# Patient Record
Sex: Male | Born: 1970 | Race: Black or African American | Hispanic: No | Marital: Single | Smoking: Never smoker
Health system: Southern US, Community
[De-identification: ages and names within clinical notes are randomized; demographics above are authoritative.]

## PROBLEM LIST (undated history)

## (undated) DIAGNOSIS — S0990XA Unspecified injury of head, initial encounter: Secondary | ICD-10-CM

## (undated) DIAGNOSIS — R569 Unspecified convulsions: Secondary | ICD-10-CM

## (undated) DIAGNOSIS — R0600 Dyspnea, unspecified: Secondary | ICD-10-CM

## (undated) DIAGNOSIS — S069XAA Unspecified intracranial injury with loss of consciousness status unknown, initial encounter: Secondary | ICD-10-CM

## (undated) DIAGNOSIS — Z87442 Personal history of urinary calculi: Secondary | ICD-10-CM

## (undated) DIAGNOSIS — T8859XA Other complications of anesthesia, initial encounter: Secondary | ICD-10-CM

## (undated) DIAGNOSIS — M419 Scoliosis, unspecified: Secondary | ICD-10-CM

## (undated) DIAGNOSIS — G43909 Migraine, unspecified, not intractable, without status migrainosus: Secondary | ICD-10-CM

## (undated) DIAGNOSIS — T148XXA Other injury of unspecified body region, initial encounter: Secondary | ICD-10-CM

## (undated) DIAGNOSIS — I639 Cerebral infarction, unspecified: Secondary | ICD-10-CM

## (undated) DIAGNOSIS — I10 Essential (primary) hypertension: Secondary | ICD-10-CM

## (undated) HISTORY — DX: Migraine, unspecified, not intractable, without status migrainosus: G43.909

## (undated) HISTORY — PX: KNEE ARTHROSCOPY: SUR90

## (undated) HISTORY — DX: Essential (primary) hypertension: I10

---

## 2009-09-13 ENCOUNTER — Emergency Department: Payer: Self-pay | Admitting: Emergency Medicine

## 2011-05-07 ENCOUNTER — Emergency Department: Payer: Self-pay | Admitting: Emergency Medicine

## 2011-05-12 ENCOUNTER — Emergency Department: Payer: Self-pay | Admitting: Emergency Medicine

## 2019-03-18 ENCOUNTER — Other Ambulatory Visit: Payer: Self-pay

## 2019-03-18 ENCOUNTER — Emergency Department
Admission: EM | Admit: 2019-03-18 | Discharge: 2019-03-18 | Disposition: A | Discharge status: Home / Self Care | Payer: PRIVATE HEALTH INSURANCE | Attending: Emergency Medicine | Admitting: Emergency Medicine

## 2019-03-18 ENCOUNTER — Encounter: Payer: Self-pay | Admitting: Emergency Medicine

## 2019-03-18 DIAGNOSIS — H5712 Ocular pain, left eye: Secondary | ICD-10-CM | POA: Diagnosis present

## 2019-03-18 DIAGNOSIS — H11442 Conjunctival cysts, left eye: Secondary | ICD-10-CM

## 2019-03-18 MED ORDER — TETRACAINE HCL 0.5 % OP SOLN
2.0000 [drp] | Freq: Once | OPHTHALMIC | Status: AC
  Administered 2019-03-18: 2 [drp] via OPHTHALMIC
  Filled 2019-03-18: qty 4

## 2019-03-18 MED ORDER — KETOROLAC TROMETHAMINE 0.5 % OP SOLN: 1.0000 [drp] | Freq: Four times a day (QID) | OPHTHALMIC | 0 refills | Status: DC

## 2019-03-18 MED ORDER — DEXAMETHASONE 0.1 % OP SUSP: 2.0000 [drp] | Freq: Two times a day (BID) | OPHTHALMIC | 0 refills | Status: DC

## 2019-03-18 NOTE — ED Provider Notes (Signed)
[facility] Emergency Department Provider Note  ____________________________________________  Time seen: Approximately 3:06 PM  I have reviewed the triage vital signs and the nursing notes.   HISTORY  Chief Complaint Eye Problem    HPI Micheal Baxter is a 48 y.o. male who presents the emergency department complaining of a "knot" on the left eye.  Patient reports that last night he felt as if an eyelash had fallen into his left eye and become irritated.  He denied any visual changes.  He rinse the eye out with saline and went to bed.  He awoke with continued foreign body sensation to the left eye.  Patient looked in the mirror and saw a "bulging knot" to the lateral aspect of the eye.  Patient denies any visual changes.  No trauma to the eye.  Patient does wear contacts but has not been wearing contacts recently.  No erythema or drainage from the left eye.  No other complaints at this time.         History reviewed. No pertinent past medical history.  There are no active problems to display for this patient.   History reviewed. No pertinent surgical history.  Prior to Admission medications   Medication Sig Start Date End Date Taking? Authorizing Provider  dexamethasone (DECADRON) 0.1 % ophthalmic suspension Place 2 drops into the left eye 2 (two) times daily. 03/18/19   Sheyla Zaffino, Delorise RoyalsJonathan D, PA-C  ketorolac (ACULAR) 0.5 % ophthalmic solution Place 1 drop into the right eye 4 (four) times daily. 03/18/19   Brendalyn Vallely, Delorise RoyalsJonathan D, PA-C    Allergies Patient has no known allergies.  No family history on file.  Social History Social History   Tobacco Use  . Smoking status: Not on file  Substance Use Topics  . Alcohol use: Not on file  . Drug use: Not on file     Review of Systems  Constitutional: No fever/chills Eyes: No visual changes. No discharge.  Positive for pain and "bulging knot" to the left lateral eye ENT: No upper respiratory  complaints. Cardiovascular: no chest pain. Respiratory: no cough. No SOB. Gastrointestinal: No abdominal pain.  No nausea, no vomiting.   Musculoskeletal: Negative for musculoskeletal pain. Skin: Negative for rash, abrasions, lacerations, ecchymosis. Neurological: Negative for headaches, focal weakness or numbness. 10-point ROS otherwise negative.  ____________________________________________   PHYSICAL EXAM:  VITAL SIGNS: ED Triage Vitals  Enc Vitals Group     BP 03/18/19 1435 (!) 149/106     Pulse Rate 03/18/19 1435 86     Resp 03/18/19 1435 20     Temp 03/18/19 1435 98.6 F (37 C)     Temp Source 03/18/19 1435 Oral     SpO2 03/18/19 1435 97 %     Weight 03/18/19 1436 210 lb (95.3 kg)     Height 03/18/19 1436 5\' 11"  (1.803 m)     Head Circumference --      Peak Flow --      Pain Score 03/18/19 1436 7     Pain Loc --      Pain Edu? --      Excl. in [place]? --      Constitutional: Alert and oriented. Well appearing and in no acute distress. Eyes: Conjunctivae are normal. PERRL. EOMI. on exam of the left eye, patient does have vesiculation of the lateral cornea.  This appears to be clear, fluid-filled lesion.  No drainage.  No surrounding erythema.  No subconjunctival hemorrhaging.  No visible foreign body.  Funduscopic exam reveals red reflex bilaterally.  No hyphema.  Vasculature and optic disc is unremarkable in the left eye.  No evidence of retinal detachment. Head: Atraumatic. ENT:      Ears:       Nose: No congestion/rhinnorhea.      Mouth/Throat: Mucous membranes are moist.  Neck: No stridor.    Cardiovascular: Normal rate, regular rhythm. Normal S1 and S2.  Good peripheral circulation. Respiratory: Normal respiratory effort without tachypnea or retractions. Lungs CTAB. Good air entry to the bases with no decreased or absent breath sounds. Musculoskeletal: Full range of motion to all extremities. No gross deformities appreciated. Neurologic:  Normal speech and  language. No gross focal neurologic deficits are appreciated.  Skin:  Skin is warm, dry and intact. No rash noted. Psychiatric: Mood and affect are normal. Speech and behavior are normal. Patient exhibits appropriate insight and judgement.   ____________________________________________   LABS (all labs ordered are listed, but only abnormal results are displayed)  Labs Reviewed - No data to display ____________________________________________  EKG   ____________________________________________  RADIOLOGY   No results found.  ____________________________________________    PROCEDURES  Procedure(s) performed:    Procedures    Medications  tetracaine (PONTOCAINE) 0.5 % ophthalmic solution 2 drop (has no administration in time range)     ____________________________________________   INITIAL IMPRESSION / ASSESSMENT AND PLAN / ED COURSE  Pertinent labs & imaging results that were available during my care of the patient were reviewed by me and considered in my medical decision making (see chart for details).  Review of the [place] CSRS was performed in accordance of the [facility] prior to dispensing any controlled drugs.           Patient's diagnosis is consistent with conjunctival cyst.  Patient presents emergency department with a "bulging knot" to the left lateral eye.  On exam, patient has appears to be a conjunctival cyst.  This is likely from excessive rubbing from a foreign body sensation last night versus allergic.  Patient will be prescribed topical steroids and Acular for symptom improvement.  No further work-up deemed necessary at this time.  Patient will follow-up with ophthalmology if symptoms do not improve..  Patient is given ED precautions to return to the ED for any worsening or new symptoms.     ____________________________________________  FINAL CLINICAL IMPRESSION(S) / ED DIAGNOSES  Final diagnoses:  Conjunctival cyst of left eye      NEW  MEDICATIONS STARTED DURING THIS VISIT:  ED Discharge Orders         Ordered    dexamethasone (DECADRON) 0.1 % ophthalmic suspension  2 times daily     03/18/19 1529    ketorolac (ACULAR) 0.5 % ophthalmic solution  4 times daily     03/18/19 1529              This chart was dictated using voice recognition software/Dragon. Despite best efforts to proofread, errors can occur which can change the meaning. Any change was purely unintentional.    Racheal Patches, PA-C 03/18/19 1536    Minna Antis, MD 03/18/19 2144

## 2019-03-18 NOTE — ED Notes (Signed)
Says felt something in eye since yesterday. Painful today in left eye and has knot on outer conjutiva.  He is in nad and says pain is adequately controlled by aleve he took at home.

## 2019-03-18 NOTE — ED Notes (Signed)
Visual acuity testing (Snellen chart): 20/20 R/eye; 20/30 L/eye

## 2019-03-18 NOTE — ED Triage Notes (Signed)
L eye irritation since yesterday. 

## 2019-04-05 ENCOUNTER — Other Ambulatory Visit: Payer: Self-pay | Admitting: Ophthalmology

## 2019-04-05 DIAGNOSIS — H0589 Other disorders of orbit: Secondary | ICD-10-CM

## 2019-04-10 ENCOUNTER — Ambulatory Visit: Admission: RE | Admit: 2019-04-10 | Payer: PRIVATE HEALTH INSURANCE | Source: Ambulatory Visit

## 2019-04-24 ENCOUNTER — Ambulatory Visit
Admission: RE | Admit: 2019-04-24 | Discharge: 2019-04-24 | Disposition: A | Discharge status: Home / Self Care | Payer: Medicaid - Out of State | Source: Ambulatory Visit | Attending: Ophthalmology | Admitting: Ophthalmology

## 2019-04-24 ENCOUNTER — Other Ambulatory Visit: Payer: Self-pay

## 2019-04-24 DIAGNOSIS — H0589 Other disorders of orbit: Secondary | ICD-10-CM

## 2019-04-24 MED ORDER — GADOBUTROL 1 MMOL/ML IV SOLN
9.0000 mL | Freq: Once | INTRAVENOUS | Status: AC | PRN
  Administered 2019-04-24: 9 mL via INTRAVENOUS

## 2019-08-01 ENCOUNTER — Emergency Department
Admission: EM | Admit: 2019-08-01 | Discharge: 2019-08-01 | Disposition: A | Discharge status: Home / Self Care | Payer: PRIVATE HEALTH INSURANCE | Attending: Emergency Medicine | Admitting: Emergency Medicine

## 2019-08-01 ENCOUNTER — Encounter: Payer: Self-pay | Admitting: Emergency Medicine

## 2019-08-01 ENCOUNTER — Other Ambulatory Visit: Payer: Self-pay

## 2019-08-01 DIAGNOSIS — M545 Low back pain, unspecified: Secondary | ICD-10-CM

## 2019-08-01 MED ORDER — NAPROXEN 500 MG PO TABS: 500.0000 mg | ORAL_TABLET | Freq: Two times a day (BID) | ORAL | 0 refills | Status: DC

## 2019-08-01 MED ORDER — KETOROLAC TROMETHAMINE 30 MG/ML IJ SOLN
30.0000 mg | Freq: Once | INTRAMUSCULAR | Status: AC
  Administered 2019-08-01: 10:00:00 30 mg via INTRAMUSCULAR
  Filled 2019-08-01: qty 1

## 2019-08-01 MED ORDER — METHOCARBAMOL 500 MG PO TABS: ORAL_TABLET | ORAL | 0 refills | Status: DC

## 2019-08-01 MED ORDER — METHOCARBAMOL 500 MG PO TABS
1000.0000 mg | ORAL_TABLET | Freq: Once | ORAL | Status: AC
  Administered 2019-08-01: 1000 mg via ORAL
  Filled 2019-08-01: qty 2

## 2019-08-01 NOTE — ED Notes (Signed)
See triage note  Presents with lower back pain states he bent down and developed pain to back yesterday  States he has an old hx of injury to back approx 7 -8 years ago ambulates well to treatment area

## 2019-08-01 NOTE — ED Triage Notes (Signed)
Back pain. Says history of same.  Got bad at 11a yesterday at work.  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

## 2019-08-01 NOTE — Discharge Instructions (Signed)
Follow-up with your primary care provider or 1 of the clinics listed on your discharge papers to obtain a primary care provider.  Dr. Harlow Mares is the orthopedist on call and you should follow-up with him for any continued back problems.  Begin taking the medication as directed.  The muscle relaxant may cause drowsiness and should only be taken when you are at home.  Naproxen 500 mg's twice daily with food.  The methocarbamol is 1 or 2 tablets every 6 hours as needed for muscle spasms.  You may use ice or heat to your back as needed for discomfort.

## 2019-08-01 NOTE — ED Provider Notes (Signed)
[facility] Emergency Department Provider Note  ____________________________________________   First MD Initiated Contact with Patient 08/01/19 [phone]     (approximate)  I have reviewed the triage vital signs and the nursing notes.   HISTORY  Chief Complaint Back Pain   HPI Micheal Baxter is a 48 y.o. male presents to the ED with complaint of low back pain especially when he bends over or movement.  He states that he has a history of back injuries 7 to 8 years ago and since moving to [place] has not seen a orthopedist.  Patient believes that his back is aggravated by the lifting that he does at work.  He complains of right low back pain without radiation to his leg.  He rates his pain as an 8 out of 10.     History reviewed. No pertinent past medical history.  There are no active problems to display for this patient.   History reviewed. No pertinent surgical history.  Prior to Admission medications   Medication Sig Start Date End Date Taking? Authorizing Provider  methocarbamol (ROBAXIN) 500 MG tablet 1 or 2 tablets every 6 hours as needed for muscle spasms. 08/01/19   Johnn Hai, PA-C  naproxen (NAPROSYN) 500 MG tablet Take 1 tablet (500 mg total) by mouth 2 (two) times daily with a meal. 08/01/19   Johnn Hai, PA-C    Allergies Patient has no known allergies.  No family history on file.  Social History Social History   Tobacco Use  . Smoking status: Never Smoker  . Smokeless tobacco: Never Used  Substance Use Topics  . Alcohol use: Never    Frequency: Never  . Drug use: Not on file    Review of Systems Constitutional: No fever/chills Cardiovascular: Denies chest pain. Respiratory: Denies shortness of breath. Gastrointestinal: No abdominal pain.  No nausea, no vomiting.  No diarrhea.  No constipation. Genitourinary: Negative for dysuria. Musculoskeletal: Positive for low back pain. Neurological: Negative for  focal  weakness or numbness. ____________________________________________   PHYSICAL EXAM:  VITAL SIGNS: ED Triage Vitals  Enc Vitals Group     BP 08/01/19 0905 129/87     Pulse Rate 08/01/19 0905 63     Resp 08/01/19 0905 19     Temp 08/01/19 0905 98 F (36.7 C)     Temp Source 08/01/19 0905 Oral     SpO2 08/01/19 0905 99 %     Weight --      Height --      Head Circumference --      Peak Flow --      Pain Score 08/01/19 0901 8     Pain Loc --      Pain Edu? --      Excl. in [place]? --     Constitutional: Alert and oriented. Well appearing and in no acute distress. Eyes: Conjunctivae are normal.  Head: Atraumatic. Neck: No stridor.   Cardiovascular: Normal rate, regular rhythm. Grossly normal heart sounds.  Good peripheral circulation. Respiratory: Normal respiratory effort.  No retractions. Lungs CTAB. Gastrointestinal: Soft and nontender. No distention. No CVA tenderness. Musculoskeletal: No point tenderness is noted on palpation of the vertebral bodies and no soft tissue injury or edema is noted.  No discoloration.  There is more soft tissue tenderness noted on the right lateral paravertebral muscles.  Good muscle strength bilaterally.  Straight leg raises were negative.  Normal gait was noted. Neurologic:  Normal speech and language. No  gross focal neurologic deficits are appreciated.  Reflexes are 1+ bilaterally.  No gait instability. Skin:  Skin is warm, dry and intact. No rash noted. Psychiatric: Mood and affect are normal. Speech and behavior are normal.  ____________________________________________   LABS (all labs ordered are listed, but only abnormal results are displayed)  Labs Reviewed - No data to display  PROCEDURES  Procedure(s) performed (including Critical Care):  Procedures   ____________________________________________   INITIAL IMPRESSION / ASSESSMENT AND PLAN / ED COURSE  As part of my medical decision making, I reviewed the following data within  the electronic MEDICAL RECORD NUMBER Notes from prior ED visits and [place] Controlled Substance Database  48 year old male presents to the ED with complaint of back pain with bending and lifting at work.  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 case.  He has a history of back problems proximately 7 or 8 years ago but has not seen an orthopedist since.  Neurologically patient was intact.  He was tender right paravertebral muscles.  Patient was ambulatory without any assistance.  He was given an injection of Toradol and given methocarbamol 1000 mg p.o.  Patient was discharged with methocarbamol and naproxen.  He is encouraged to follow-up with Dr. Odis LusterBowers who is the orthopedist on call today if any continued problems.     ____________________________________________   FINAL CLINICAL IMPRESSION(S) / ED DIAGNOSES  Final diagnoses:  Acute right-sided low back pain without sciatica     ED Discharge Orders         Ordered    naproxen (NAPROSYN) 500 MG tablet  2 times daily with meals     08/01/19 1003    methocarbamol (ROBAXIN) 500 MG tablet     08/01/19 1003           Note:  This document was prepared using Dragon voice recognition software and may include unintentional dictation errors.    Tommi RumpsSummers, Gabbie Marzo L, PA-C 08/01/19 1231    Chesley NoonJessup, Charles, MD 08/01/19 [phone]

## 2020-10-16 ENCOUNTER — Other Ambulatory Visit: Payer: Self-pay

## 2020-10-16 ENCOUNTER — Ambulatory Visit
Admission: EM | Admit: 2020-10-16 | Discharge: 2020-10-16 | Disposition: A | Discharge status: Home / Self Care | Payer: PRIVATE HEALTH INSURANCE | Attending: Emergency Medicine | Admitting: Emergency Medicine

## 2020-10-16 DIAGNOSIS — J069 Acute upper respiratory infection, unspecified: Secondary | ICD-10-CM

## 2020-10-16 MED ORDER — BENZONATATE 100 MG PO CAPS: 200.0000 mg | ORAL_CAPSULE | Freq: Three times a day (TID) | ORAL | 0 refills | Status: DC

## 2020-10-16 MED ORDER — PROMETHAZINE-DM 6.25-15 MG/5ML PO SYRP: 5.0000 mL | ORAL_SOLUTION | Freq: Four times a day (QID) | ORAL | 0 refills | Status: DC | PRN

## 2020-10-16 NOTE — ED Provider Notes (Signed)
[facility]    CSN: [id number] Arrival date & time: 10/16/20  1555      History   Chief Complaint Chief Complaint  Patient presents with   Cough   Nasal Congestion    HPI Micheal Baxter is a 49 y.o. male.   HPI   49 year old male here for evaluation of cough and nasal congestion.  Patient reports that he has had symptoms for the past 3 days and these include a dry cough, sneezing, and clear nasal discharge.  Patient denies fever, shortness of breath, wheezing, body aches, changes to taste or smell, nausea, vomiting, diarrhea, sore throat, or headache.  Not had his flu shot or Covid vaccine.  History reviewed. No pertinent past medical history.  There are no problems to display for this patient.   History reviewed. No pertinent surgical history.     Home Medications    Prior to Admission medications   Medication Sig Start Date End Date Taking? Authorizing Provider  benzonatate (TESSALON) 100 MG capsule Take 2 capsules (200 mg total) by mouth every 8 (eight) hours. 10/16/20   Margarette Canada, NP  methocarbamol (ROBAXIN) 500 MG tablet 1 or 2 tablets every 6 hours as needed for muscle spasms. 08/01/19   Johnn Hai, PA-C  naproxen (NAPROSYN) 500 MG tablet Take 1 tablet (500 mg total) by mouth 2 (two) times daily with a meal. 08/01/19   Johnn Hai, PA-C  promethazine-dextromethorphan (PROMETHAZINE-DM) 6.25-15 MG/5ML syrup Take 5 mLs by mouth 4 (four) times daily as needed. 10/16/20   Margarette Canada, NP    Family History Family History  Problem Relation Age of Onset   Healthy Mother    Healthy Father     Social History Social History   Tobacco Use   Smoking status: Never Smoker   Smokeless tobacco: Never Used  Substance Use Topics   Alcohol use: Not Currently   Drug use: Not Currently    Types: Marijuana     Allergies   Patient has no known allergies.   Review of Systems Review of Systems  Constitutional: Negative for  activity change, appetite change and fever.  HENT: Positive for congestion and rhinorrhea. Negative for sinus pain and sore throat.   Respiratory: Positive for cough. Negative for shortness of breath and wheezing.   Cardiovascular: Negative for chest pain.  Gastrointestinal: Negative for diarrhea, nausea and vomiting.  Musculoskeletal: Negative for arthralgias and myalgias.  Skin: Negative.   Neurological: Negative for headaches.  Hematological: Negative.   Psychiatric/Behavioral: Negative.      Physical Exam Triage Vital Signs ED Triage Vitals  Enc Vitals Group     BP --      Pulse --      Resp --      Temp --      Temp Source 10/16/20 1642 Oral     SpO2 --      Weight --      Height --      Head Circumference --      Peak Flow --      Pain Score 10/16/20 1641 0     Pain Loc --      Pain Edu? --      Excl. in [place]? --    No data found.  Updated Vital Signs BP (!) 117/97 (BP Location: Right Arm)    Pulse 86    Temp 98.1 F (36.7 C) (Oral)    Resp 17    SpO2 98%  Visual Acuity Right Eye Distance:   Left Eye Distance:   Bilateral Distance:    Right Eye Near:   Left Eye Near:    Bilateral Near:     Physical Exam Vitals and nursing note reviewed.  Constitutional:      General: He is not in acute distress.    Appearance: Normal appearance.  HENT:     Head: Normocephalic and atraumatic.     Right Ear: Tympanic membrane, ear canal and external ear normal.     Left Ear: Tympanic membrane, ear canal and external ear normal.     Ears:     Comments: Patient has cerumen in both canals but the TMs are visualized beyond.  TMs are pearly gray without erythema or injection.    Nose: Congestion and rhinorrhea present.     Comments: Mucosa is edematous but no erythema.  There is clear nasal discharge in both nares.    Mouth/Throat:     Mouth: Mucous membranes are moist.     Pharynx: Oropharynx is clear. No oropharyngeal exudate or posterior oropharyngeal erythema.      Comments: Patient has clear postnasal drip. Eyes:     Extraocular Movements: Extraocular movements intact.     Conjunctiva/sclera: Conjunctivae normal.     Pupils: Pupils are equal, round, and reactive to light.  Cardiovascular:     Rate and Rhythm: Normal rate and regular rhythm.     Pulses: Normal pulses.     Heart sounds: Normal heart sounds. No murmur heard.  No gallop.   Pulmonary:     Effort: Pulmonary effort is normal.     Breath sounds: Normal breath sounds. No wheezing, rhonchi or rales.  Musculoskeletal:        General: No swelling or tenderness. Normal range of motion.     Cervical back: Normal range of motion and neck supple.  Lymphadenopathy:     Cervical: No cervical adenopathy.  Skin:    General: Skin is warm and dry.     Capillary Refill: Capillary refill takes less than 2 seconds.     Findings: No erythema or rash.  Neurological:     General: No focal deficit present.     Mental Status: He is alert and oriented to person, place, and time.  Psychiatric:        Mood and Affect: Mood normal.        Behavior: Behavior normal.        Thought Content: Thought content normal.        Judgment: Judgment normal.      UC Treatments / Results  Labs (all labs ordered are listed, but only abnormal results are displayed) Labs Reviewed - No data to display  EKG   Radiology No results found.  Procedures Procedures (including critical care time)  Medications Ordered in UC Medications - No data to display  Initial Impression / Assessment and Plan / UC Course  I have reviewed the triage vital signs and the nursing notes.  Pertinent labs & imaging results that were available during my care of the patient were reviewed by me and considered in my medical decision making (see chart for details).   Patient is here for evaluation of cough and nasal congestion that he has had for the last 3 days.  Patient's had some associated sneezing but no fever.  Nasal mucosa is  edematous without erythema and there is clear nasal discharge present as well as clear postnasal drip.  No cervical lymphadenopathy present lungs  are clear to auscultation.  Symptoms consistent with viral URI.  Will treat with benzonatate and Promethazine DM, over-the-counter Allegra, sinus irrigation.  Patient can follow-up with his primary care doctor for worsening symptoms.   Final Clinical Impressions(s) / UC Diagnoses   Final diagnoses:  Viral URI with cough     Discharge Instructions     Use the Tessalon Perles every 8 hours during the day as needed for cough and the Promethazine DM at nighttime.  Irrigate your sinuses with distilled water and a NeilMed sinus rinse kit 2-3 times a day.  You can take over-the-counter Allegra 180 mg once daily to help with runny nose and postnasal drip.  Return for new or worsening symptoms or follow-up with your primary care provider.    ED Prescriptions    Medication Sig Dispense Auth. Provider   benzonatate (TESSALON) 100 MG capsule Take 2 capsules (200 mg total) by mouth every 8 (eight) hours. 21 capsule Margarette Canada, NP   promethazine-dextromethorphan (PROMETHAZINE-DM) 6.25-15 MG/5ML syrup Take 5 mLs by mouth 4 (four) times daily as needed. 118 mL Margarette Canada, NP     PDMP not reviewed this encounter.   Margarette Canada, NP 10/16/20 1730

## 2020-10-16 NOTE — Discharge Instructions (Addendum)
Use the Tessalon Perles every 8 hours during the day as needed for cough and the Promethazine DM at nighttime.  Irrigate your sinuses with distilled water and a NeilMed sinus rinse kit 2-3 times a day.  You can take over-the-counter Allegra 180 mg once daily to help with runny nose and postnasal drip.  Return for new or worsening symptoms or follow-up with your primary care provider.

## 2020-10-16 NOTE — ED Triage Notes (Signed)
Pt is here with nasal congestion & cough that started last night, pt has taken OTC meds to relieve discomfort.

## 2020-11-13 ENCOUNTER — Ambulatory Visit
Admission: EM | Admit: 2020-11-13 | Discharge: 2020-11-13 | Disposition: A | Discharge status: Home / Self Care | Payer: Medicaid Other | Attending: Sports Medicine | Admitting: Sports Medicine

## 2020-11-13 ENCOUNTER — Other Ambulatory Visit: Payer: Self-pay

## 2020-11-13 DIAGNOSIS — M25612 Stiffness of left shoulder, not elsewhere classified: Secondary | ICD-10-CM

## 2020-11-13 DIAGNOSIS — M25512 Pain in left shoulder: Secondary | ICD-10-CM

## 2020-11-13 DIAGNOSIS — G8929 Other chronic pain: Secondary | ICD-10-CM

## 2020-11-13 MED ORDER — METHOCARBAMOL 500 MG PO TABS: 500.0000 mg | ORAL_TABLET | Freq: Three times a day (TID) | ORAL | 0 refills | Status: DC | PRN

## 2020-11-13 MED ORDER — NAPROXEN 500 MG PO TABS: 500.0000 mg | ORAL_TABLET | Freq: Two times a day (BID) | ORAL | 0 refills | Status: DC

## 2020-11-13 NOTE — ED Provider Notes (Addendum)
[facility]    CSN: [id number] Arrival date & time: 11/13/20  1553      History   Chief Complaint No chief complaint on file.   HPI Micheal Baxter is a 49 y.o. male.   49 year old right-hand-dominant male who presents for evaluation of an acute flare of some chronic left shoulder pain.  Patient claims that he fractured his shoulder when he was up in [place] more than a year ago.  He says "it is still broke".  He has not had any evaluation in [place] and has had no x-rays.  He works at [employer] and due to pain did not go to work today and presents today to get a work note.  He also reports some occasional numbness and tingling into his left arm but denies any significant neck symptoms.  No red flag signs or symptoms offered.     History reviewed. No pertinent past medical history.  There are no problems to display for this patient.   History reviewed. No pertinent surgical history.     Home Medications    Prior to Admission medications   Medication Sig Start Date End Date Taking? Authorizing Provider  benzonatate (TESSALON) 100 MG capsule Take 2 capsules (200 mg total) by mouth every 8 (eight) hours. 10/16/20   Becky Augusta, NP  methocarbamol (ROBAXIN) 500 MG tablet Take 1 tablet (500 mg total) by mouth every 8 (eight) hours as needed for muscle spasms. 11/13/20   Delton See, MD  naproxen (NAPROSYN) 500 MG tablet Take 1 tablet (500 mg total) by mouth 2 (two) times daily with a meal. 11/13/20   Delton See, MD  promethazine-dextromethorphan (PROMETHAZINE-DM) 6.25-15 MG/5ML syrup Take 5 mLs by mouth 4 (four) times daily as needed. 10/16/20   Becky Augusta, NP    Family History Family History  Problem Relation Age of Onset  . Healthy Mother   . Healthy Father     Social History Social History   Tobacco Use  . Smoking status: Never Smoker  . Smokeless tobacco: Never Used  Substance Use Topics  . Alcohol use: Not Currently  .  Drug use: Not Currently    Types: Marijuana     Allergies   Patient has no known allergies.   Review of Systems Positive for left shoulder pain, decreased range of motion, and stiffness.  Remainder of 14 point review of systems is negative.   Physical Exam Triage Vital Signs ED Triage Vitals  Enc Vitals Group     BP 11/13/20 1606 (!) 118/95     Pulse Rate 11/13/20 1606 81     Resp 11/13/20 1606 18     Temp 11/13/20 1606 98.5 F (36.9 C)     Temp Source 11/13/20 1606 Oral     SpO2 11/13/20 1606 97 %     Weight --      Height --      Head Circumference --      Peak Flow --      Pain Score 11/13/20 1558 8     Pain Loc --      Pain Edu? --      Excl. in GC? --    No data found.  Updated Vital Signs BP (!) 118/95 (BP Location: Right Arm)   Pulse 81   Temp 98.5 F (36.9 C) (Oral)   Resp 18   SpO2 97%   Visual Acuity Right Eye Distance:   Left Eye Distance:   Bilateral Distance:  Right Eye Near:   Left Eye Near:    Bilateral Near:     Physical Exam Vitals and nursing note reviewed.  Constitutional:      Appearance: Normal appearance.  Musculoskeletal:     Comments: Musculoskeletal:       Cervical spine: Patient has essentially full range of motion but is somewhat limited due to discomfort.  Strength is well-preserved.  Spurling's test is negative bilaterally.  There is no evidence of any discogenic pathology.  Right shoulder is within normal limits.  Left shoulder patient has some tenderness over the [facility] joint but with distraction it is somewhat reduced.  Some mild tenderness in the bicipital groove.  There is no obvious bony abnormality ecchymosis erythema or soft tissue swelling.  His range of motion is limited in all planes secondary to pain and is somewhat effort dependent.  That said, evaluation of internal rotation external rotation abduction and forward flexion reveals no significant focal weakness and no evidence of any significant rotator cuff tear.   Unable to fully evaluate the labrum or evaluate for any impingement syndrome secondary to patient compliance.    Neurological:     Mental Status: He is alert.      UC Treatments / Results  Labs (all labs ordered are listed, but only abnormal results are displayed) Labs Reviewed - No data to display  EKG   Radiology No results found.  Procedures Procedures (including critical care time)  Medications Ordered in UC Medications - No data to display  Initial Impression / Assessment and Plan / UC Course  I have reviewed the triage vital signs and the nursing notes.  Pertinent labs & imaging results that were available during my care of the patient were reviewed by me and considered in my medical decision making (see chart for details).     Clinical impression chronic left shoulder pain with a history of a possible fracture although I do not have access to any medical records.  The findings and treatment plan were discussed in detail with the patient.  Patient was in agreement.  I discussed the findings with the patient and his limited exam.  He was asking whether he can get an MRI and I indicated I could not get that through an urgent care setting based on his exam findings.  He needed to follow-up with orthopedics and he will do this on his own.  In the meantime I will give him an anti-inflammatory which he can use as prescribed.  He can also supplement that with Tylenol.  I advised him not to take any Motrin, Advil, ibuprofen, Naprosyn, or Aleve with the anti-inflammatory prescribed.  Continue with supportive care icing activity as tolerated.  I also gave him a work note excusing him from work today and he will return to work as scheduled on Monday, December 20. Final Clinical Impressions(s) / UC Diagnoses   Final diagnoses:  Chronic left shoulder pain  Post-traumatic stiffness of left shoulder joint   Discharge Instructions   None    ED Prescriptions    Medication Sig  Dispense Auth. Provider   methocarbamol (ROBAXIN) 500 MG tablet Take 1 tablet (500 mg total) by mouth every 8 (eight) hours as needed for muscle spasms. 20 tablet Delton See, MD   naproxen (NAPROSYN) 500 MG tablet Take 1 tablet (500 mg total) by mouth 2 (two) times daily with a meal. 20 tablet Delton See, MD     PDMP not reviewed this encounter.   Delton See,  MD 11/13/20 1731    Delton See, MD 11/13/20 1737

## 2020-11-13 NOTE — ED Triage Notes (Addendum)
Pt is here with left shoulder numbness that started last week and pain to where he cant lift his left shoulder started last night. Pt has taken Tylenol/Aleve to relieve discomfort, pt has a hx of left shoulder pain. Pt is requesting pain meds today and states he already knows what's wrong with his shoulder.

## 2020-12-04 ENCOUNTER — Ambulatory Visit
Admission: EM | Admit: 2020-12-04 | Discharge: 2020-12-04 | Disposition: A | Discharge status: Home / Self Care | Payer: Medicaid Other

## 2020-12-04 ENCOUNTER — Encounter: Payer: Self-pay | Admitting: Emergency Medicine

## 2020-12-04 ENCOUNTER — Ambulatory Visit
Admission: EM | Admit: 2020-12-04 | Discharge: 2020-12-04 | Disposition: A | Discharge status: Home / Self Care | Payer: HRSA Program | Attending: Physician Assistant | Admitting: Physician Assistant

## 2020-12-04 ENCOUNTER — Other Ambulatory Visit: Payer: Self-pay

## 2020-12-04 DIAGNOSIS — Z0189 Encounter for other specified special examinations: Secondary | ICD-10-CM | POA: Diagnosis present

## 2020-12-04 DIAGNOSIS — U071 COVID-19: Secondary | ICD-10-CM | POA: Insufficient documentation

## 2020-12-04 DIAGNOSIS — Z20822 Contact with and (suspected) exposure to covid-19: Secondary | ICD-10-CM

## 2020-12-04 HISTORY — DX: COVID-19: U07.1

## 2020-12-04 NOTE — ED Triage Notes (Signed)
Patient here for COVID test only, no symptoms, positive exposure.

## 2020-12-04 NOTE — Discharge Instructions (Signed)

## 2020-12-04 NOTE — ED Triage Notes (Signed)
Patient here for COVD test. No symptoms, positive exposure.

## 2020-12-05 LAB — SARS CORONAVIRUS 2 (TAT 6-24 HRS): SARS Coronavirus 2: POSITIVE — AB

## 2021-02-21 ENCOUNTER — Inpatient Hospital Stay
Admission: EM | Admit: 2021-02-21 | Discharge: 2021-02-23 | DRG: 153 | Disposition: A | Discharge status: Home / Self Care | Payer: Self-pay | Attending: Internal Medicine | Admitting: Internal Medicine

## 2021-02-21 ENCOUNTER — Other Ambulatory Visit: Payer: Self-pay

## 2021-02-21 DIAGNOSIS — I160 Hypertensive urgency: Secondary | ICD-10-CM | POA: Diagnosis present

## 2021-02-21 DIAGNOSIS — Z8616 Personal history of COVID-19: Secondary | ICD-10-CM

## 2021-02-21 DIAGNOSIS — Z79899 Other long term (current) drug therapy: Secondary | ICD-10-CM

## 2021-02-21 DIAGNOSIS — J36 Peritonsillar abscess: Principal | ICD-10-CM | POA: Diagnosis present

## 2021-02-21 DIAGNOSIS — I10 Essential (primary) hypertension: Secondary | ICD-10-CM | POA: Diagnosis present

## 2021-02-21 DIAGNOSIS — Z20822 Contact with and (suspected) exposure to covid-19: Secondary | ICD-10-CM | POA: Diagnosis present

## 2021-02-21 DIAGNOSIS — J020 Streptococcal pharyngitis: Principal | ICD-10-CM | POA: Diagnosis present

## 2021-02-21 LAB — CBC WITH DIFFERENTIAL/PLATELET
Abs Immature Granulocytes: 0.05 10*3/uL (ref 0.00–0.07)
Basophils Absolute: 0.1 10*3/uL (ref 0.0–0.1)
Basophils Relative: 0 %
Eosinophils Absolute: 0.2 10*3/uL (ref 0.0–0.5)
Eosinophils Relative: 1 %
HCT: 42.2 % (ref 39.0–52.0)
Hemoglobin: 14.7 g/dL (ref 13.0–17.0)
Immature Granulocytes: 0 %
Lymphocytes Relative: 10 %
Lymphs Abs: 1.5 10*3/uL (ref 0.7–4.0)
MCH: 29.4 pg (ref 26.0–34.0)
MCHC: 34.8 g/dL (ref 30.0–36.0)
MCV: 84.4 fL (ref 80.0–100.0)
Monocytes Absolute: 1.5 10*3/uL — ABNORMAL HIGH (ref 0.1–1.0)
Monocytes Relative: 10 %
Neutro Abs: 11.5 10*3/uL — ABNORMAL HIGH (ref 1.7–7.7)
Neutrophils Relative %: 79 %
Platelets: 235 10*3/uL (ref 150–400)
RBC: 5 MIL/uL (ref 4.22–5.81)
RDW: 12.1 % (ref 11.5–15.5)
WBC: 14.7 10*3/uL — ABNORMAL HIGH (ref 4.0–10.5)
nRBC: 0 % (ref 0.0–0.2)

## 2021-02-21 LAB — GROUP A STREP BY PCR: Group A Strep by PCR: DETECTED — AB

## 2021-02-21 LAB — MONONUCLEOSIS SCREEN: Mono Screen: NEGATIVE

## 2021-02-21 MED ORDER — DEXAMETHASONE SODIUM PHOSPHATE 10 MG/ML IJ SOLN
10.0000 mg | Freq: Once | INTRAMUSCULAR | Status: AC
  Administered 2021-02-22: 10 mg via INTRAVENOUS
  Filled 2021-02-21: qty 1

## 2021-02-21 NOTE — ED Provider Notes (Signed)
[facility] Emergency Department Provider Note  ____________________________________________   Event Date/Time   First MD Initiated Contact with Patient 02/21/21 2145     (approximate)  I have reviewed the triage vital signs and the nursing notes.   HISTORY  Chief Complaint Sore Throat    HPI MACALLAN ORD is a 50 y.o. male otherwise healthy comes in with neck swelling.  Patient reports that a few days ago he thought he had strep throat.  He states that his mom is a Engineer, civil (consulting) and stated that he needed to get his tonsils out but he never did.  However he never had a positive strep contrary to triage note.  He states that he is been taking some over-the-counter medication to help with the sore throat but the pain is gotten a lot worse, constant, worse on the right side, nothing makes it better.  He states that it hurts to talk.  Denies  any chest pain, shortness of breath, abdominal pain, urinary symptoms          History reviewed. No pertinent past medical history.  There are no problems to display for this patient.   History reviewed. No pertinent surgical history.  Prior to Admission medications   Medication Sig Start Date End Date Taking? Authorizing Provider  benzonatate (TESSALON) 100 MG capsule Take 2 capsules (200 mg total) by mouth every 8 (eight) hours. 10/16/20   Becky Augusta, NP  methocarbamol (ROBAXIN) 500 MG tablet Take 1 tablet (500 mg total) by mouth every 8 (eight) hours as needed for muscle spasms. 11/13/20   Delton See, MD  naproxen (NAPROSYN) 500 MG tablet Take 1 tablet (500 mg total) by mouth 2 (two) times daily with a meal. 11/13/20   Delton See, MD  promethazine-dextromethorphan (PROMETHAZINE-DM) 6.25-15 MG/5ML syrup Take 5 mLs by mouth 4 (four) times daily as needed. 10/16/20   Becky Augusta, NP    Allergies Patient has no known allergies.  Family History  Problem Relation Age of Onset  . Healthy Mother   .  Healthy Father     Social History Social History   Tobacco Use  . Smoking status: Never Smoker  . Smokeless tobacco: Never Used  Substance Use Topics  . Alcohol use: Not Currently  . Drug use: Not Currently    Types: Marijuana      Review of Systems Constitutional: No fever/chills Eyes: No visual changes. ENT: Positive sore throat neck pain Cardiovascular: Denies chest pain. Respiratory: Denies shortness of breath. Gastrointestinal: No abdominal pain.  No nausea, no vomiting.  No diarrhea.  No constipation. Genitourinary: Negative for dysuria. Musculoskeletal: Negative for back pain. Skin: Negative for rash. Neurological: Negative for headaches, focal weakness or numbness. All other ROS negative ____________________________________________   PHYSICAL EXAM:  VITAL SIGNS: ED Triage Vitals  Enc Vitals Group     BP 02/21/21 2119 (!) 156/120     Pulse Rate 02/21/21 2119 95     Resp 02/21/21 2119 18     Temp 02/21/21 2119 99.8 F (37.7 C)     Temp Source 02/21/21 2119 Oral     SpO2 02/21/21 2119 96 %     Weight 02/21/21 2120 192 lb (87.1 kg)     Height 02/21/21 2120 5\' 10"  (1.778 m)     Head Circumference --      Peak Flow --      Pain Score 02/21/21 2121 10     Pain Loc --      Pain  Edu? --      Excl. in GC? --     Constitutional: Alert and oriented. Well appearing and in no acute distress. Eyes: Conjunctivae are normal. EOMI. Head: Atraumatic. Nose: No congestion/rhinnorhea. Mouth/Throat: Mucous membranes are moist.  No obvious tonsil swelling. Neck: Swelling on the right side of his neck Cardiovascular: Normal rate, regular rhythm. Grossly normal heart sounds.  Good peripheral circulation. Respiratory: Normal respiratory effort.  No retractions. Lungs CTAB. Gastrointestinal: Soft and nontender. No distention. No abdominal bruits.  Musculoskeletal: No lower extremity tenderness nor edema.  No joint effusions. Neurologic:  Normal speech and language. No  gross focal neurologic deficits are appreciated.  Skin:  Skin is warm, dry and intact. No rash noted. Psychiatric: Mood and affect are normal. Speech and behavior are normal. GU: Deferred   ____________________________________________   LABS (all labs ordered are listed, but only abnormal results are displayed)  Labs Reviewed  GROUP A STREP BY PCR - Abnormal; Notable for the following components:      Result Value   Group A Strep by PCR DETECTED (*)    All other components within normal limits  CBC WITH DIFFERENTIAL/PLATELET - Abnormal; Notable for the following components:   WBC 14.7 (*)    Neutro Abs 11.5 (*)    Monocytes Absolute 1.5 (*)    All other components within normal limits  MONONUCLEOSIS SCREEN  COMPREHENSIVE METABOLIC PANEL   ____________________________________________   RADIOLOGY Vela Prose, personally viewed and evaluated these images (plain radiographs) as part of my medical decision making, as well as reviewing the written report by the radiologist.  ED MD interpretation: Pending at time of disposition  Official radiology report(s): No results found.  ____________________________________________   PROCEDURES  Procedure(s) performed (including Critical Care):  Procedures   ____________________________________________   INITIAL IMPRESSION / ASSESSMENT AND PLAN / ED COURSE  DNAIEL VOLLER was evaluated in Emergency Department on 02/21/2021 for the symptoms described in the history of present illness. He was evaluated in the context of the global COVID-19 pandemic, which necessitated consideration that the patient might be at risk for infection with the SARS-CoV-2 virus that causes COVID-19. Institutional protocols and algorithms that pertain to the evaluation of patients at risk for COVID-19 are in a state of rapid change based on information released by regulatory bodies including the CDC and federal and state organizations. These policies and  algorithms were followed during the patient's care in the ED.     Patient is a 50 year old male who comes in for neck pain and concern for strep throat.  On exam patient does have some fullness to the right side of his neck.  Denies any obvious peritonsillar abscess on exam.  Will get CT scan to evaluate for abscess, peritonsillar abscess, retropharyngeal abscess or other acute cause of patient's neck pain.  At this time he is tolerating his secretions but he is whispering due to the pain.  We will give a dose of Decadron to help.  Will get testing for strep, mono as well.  Patient is strep positive.  Unfortunate patient has not gotten his CT yet due to hemolysis of his CMP.  Will handoff patient to oncoming team pending CMP and CT scan.  If positive for abscess patient may need consideration for admission versus if negative patient can be discharged home with antibiotics for strep        ____________________________________________   FINAL CLINICAL IMPRESSION(S) / ED DIAGNOSES   Final diagnoses:  Strep pharyngitis  MEDICATIONS GIVEN DURING THIS VISIT:  Medications  dexamethasone (DECADRON) injection 10 mg (has no administration in time range)     ED Discharge Orders    None       Note:  This document was prepared using Dragon voice recognition software and may include unintentional dictation errors.   Concha Se, MD 02/21/21 [id number]

## 2021-02-21 NOTE — ED Triage Notes (Signed)
Pt reports having throat swelling x 3 days. Sts he had positive strep test last week and has since been treating with OTC medications and salt water rinse at home.  Denies lip or tongue swelling. Pt typing on phone to answer questions in triage. Reports difficulty breathing and cough x 1 week also. No acute distress at this time.

## 2021-02-22 ENCOUNTER — Emergency Department: Payer: Self-pay

## 2021-02-22 DIAGNOSIS — J0300 Acute streptococcal tonsillitis, unspecified: Secondary | ICD-10-CM

## 2021-02-22 DIAGNOSIS — J36 Peritonsillar abscess: Principal | ICD-10-CM

## 2021-02-22 DIAGNOSIS — I160 Hypertensive urgency: Secondary | ICD-10-CM

## 2021-02-22 DIAGNOSIS — A400 Sepsis due to streptococcus, group A: Secondary | ICD-10-CM

## 2021-02-22 DIAGNOSIS — J020 Streptococcal pharyngitis: Secondary | ICD-10-CM

## 2021-02-22 LAB — BASIC METABOLIC PANEL
Anion gap: 11 (ref 5–15)
BUN: 13 mg/dL (ref 6–20)
CO2: 27 mmol/L (ref 22–32)
Calcium: 9.2 mg/dL (ref 8.9–10.3)
Chloride: 100 mmol/L (ref 98–111)
Creatinine, Ser: 1.23 mg/dL (ref 0.61–1.24)
GFR, Estimated: >60 mL/min (ref >60)
Glucose, Bld: 176 mg/dL — ABNORMAL HIGH (ref 70–99)
Potassium: 3.8 mmol/L (ref 3.5–5.1)
Sodium: 138 mmol/L (ref 135–145)

## 2021-02-22 LAB — RESP PANEL BY RT-PCR (FLU A&B, COVID) ARPGX2
Influenza A by PCR: NEGATIVE
Influenza B by PCR: NEGATIVE
SARS Coronavirus 2 by RT PCR: NEGATIVE

## 2021-02-22 LAB — CBC
HCT: 39.5 % (ref 39.0–52.0)
Hemoglobin: 13.6 g/dL (ref 13.0–17.0)
MCH: 28.9 pg (ref 26.0–34.0)
MCHC: 34.4 g/dL (ref 30.0–36.0)
MCV: 84 fL (ref 80.0–100.0)
Platelets: 269 10*3/uL (ref 150–400)
RBC: 4.7 MIL/uL (ref 4.22–5.81)
RDW: 12 % (ref 11.5–15.5)
WBC: 15.7 10*3/uL — ABNORMAL HIGH (ref 4.0–10.5)
nRBC: 0 % (ref 0.0–0.2)

## 2021-02-22 LAB — COMPREHENSIVE METABOLIC PANEL
ALT: 13 U/L (ref 0–44)
AST: 15 U/L (ref 15–41)
Albumin: 3.7 g/dL (ref 3.5–5.0)
Alkaline Phosphatase: 63 U/L (ref 38–126)
Anion gap: 10 (ref 5–15)
BUN: 11 mg/dL (ref 6–20)
CO2: 27 mmol/L (ref 22–32)
Calcium: 8.8 mg/dL — ABNORMAL LOW (ref 8.9–10.3)
Chloride: 103 mmol/L (ref 98–111)
Creatinine, Ser: 0.96 mg/dL (ref 0.61–1.24)
GFR, Estimated: >60 mL/min (ref >60)
Glucose, Bld: 109 mg/dL — ABNORMAL HIGH (ref 70–99)
Potassium: 3.6 mmol/L (ref 3.5–5.1)
Sodium: 140 mmol/L (ref 135–145)
Total Bilirubin: 1.1 mg/dL (ref 0.3–1.2)
Total Protein: 8 g/dL (ref 6.5–8.1)

## 2021-02-22 LAB — HIV ANTIBODY (ROUTINE TESTING W REFLEX): HIV Screen 4th Generation wRfx: NONREACTIVE

## 2021-02-22 MED ORDER — SODIUM CHLORIDE 0.9 % IV SOLN
3.0000 g | Freq: Four times a day (QID) | INTRAVENOUS | Status: DC
  Administered 2021-02-22 – 2021-02-23 (×6): 3 g via INTRAVENOUS
  Filled 2021-02-22 (×3): qty 8
  Filled 2021-02-22: qty 3
  Filled 2021-02-22 (×2): qty 8
  Filled 2021-02-22: qty 3
  Filled 2021-02-22: qty 8

## 2021-02-22 MED ORDER — KETOROLAC TROMETHAMINE 30 MG/ML IJ SOLN
15.0000 mg | Freq: Once | INTRAMUSCULAR | Status: AC
  Administered 2021-02-22: 15 mg via INTRAVENOUS
  Filled 2021-02-22: qty 1

## 2021-02-22 MED ORDER — ONDANSETRON HCL 4 MG/2ML IJ SOLN: 4.0000 mg | Freq: Four times a day (QID) | INTRAMUSCULAR | Status: DC | PRN

## 2021-02-22 MED ORDER — MENTHOL 3 MG MT LOZG
1.0000 | LOZENGE | OROMUCOSAL | Status: DC | PRN
  Filled 2021-02-22: qty 9

## 2021-02-22 MED ORDER — ACETAMINOPHEN 325 MG PO TABS
650.0000 mg | ORAL_TABLET | Freq: Four times a day (QID) | ORAL | Status: DC | PRN
  Administered 2021-02-23: 650 mg via ORAL
  Filled 2021-02-22 (×2): qty 2

## 2021-02-22 MED ORDER — MORPHINE SULFATE (PF) 2 MG/ML IV SOLN: 2.0000 mg | INTRAVENOUS | Status: DC | PRN

## 2021-02-22 MED ORDER — DEXAMETHASONE SODIUM PHOSPHATE 4 MG/ML IJ SOLN
4.0000 mg | Freq: Two times a day (BID) | INTRAMUSCULAR | Status: DC
  Administered 2021-02-22 – 2021-02-23 (×3): 4 mg via INTRAVENOUS
  Filled 2021-02-22 (×4): qty 1

## 2021-02-22 MED ORDER — TRAZODONE HCL 50 MG PO TABS: 25.0000 mg | ORAL_TABLET | Freq: Every evening | ORAL | Status: DC | PRN

## 2021-02-22 MED ORDER — KETOROLAC TROMETHAMINE 15 MG/ML IJ SOLN
15.0000 mg | Freq: Four times a day (QID) | INTRAMUSCULAR | Status: DC | PRN
  Filled 2021-02-22: qty 1

## 2021-02-22 MED ORDER — ACETAMINOPHEN 650 MG RE SUPP: 650.0000 mg | Freq: Four times a day (QID) | RECTAL | Status: DC | PRN

## 2021-02-22 MED ORDER — IOHEXOL 300 MG/ML  SOLN
75.0000 mL | Freq: Once | INTRAMUSCULAR | Status: AC | PRN
  Administered 2021-02-22: 75 mL via INTRAVENOUS

## 2021-02-22 MED ORDER — CLINDAMYCIN PHOSPHATE 600 MG/50ML IV SOLN
600.0000 mg | Freq: Once | INTRAVENOUS | Status: AC
  Administered 2021-02-22: 600 mg via INTRAVENOUS
  Filled 2021-02-22 (×2): qty 50

## 2021-02-22 MED ORDER — LOSARTAN POTASSIUM 25 MG PO TABS
25.0000 mg | ORAL_TABLET | Freq: Every day | ORAL | Status: DC
  Administered 2021-02-22: 25 mg via ORAL
  Filled 2021-02-22 (×2): qty 1

## 2021-02-22 MED ORDER — MAGNESIUM HYDROXIDE 400 MG/5ML PO SUSP: 30.0000 mL | Freq: Every day | ORAL | Status: DC | PRN

## 2021-02-22 MED ORDER — POTASSIUM CHLORIDE 20 MEQ PO PACK: 40.0000 meq | PACK | Freq: Once | ORAL | Status: DC

## 2021-02-22 MED ORDER — ENOXAPARIN SODIUM 40 MG/0.4ML ~~LOC~~ SOLN
40.0000 mg | SUBCUTANEOUS | Status: DC
  Administered 2021-02-22: 40 mg via SUBCUTANEOUS
  Filled 2021-02-22 (×2): qty 0.4

## 2021-02-22 MED ORDER — SODIUM CHLORIDE 0.9 % IV SOLN: INTRAVENOUS | Status: DC

## 2021-02-22 MED ORDER — ONDANSETRON HCL 4 MG PO TABS: 4.0000 mg | ORAL_TABLET | Freq: Four times a day (QID) | ORAL | Status: DC | PRN

## 2021-02-22 NOTE — ED Notes (Signed)
cmp resent, warm blanket provided

## 2021-02-22 NOTE — Progress Notes (Signed)
No charge progress note.  Micheal Baxter is a 50 y.o. African-American male with history of hypertension who presented to the emergency room with acute onset of worsening right-sided sore throat, difficulty swallowing and unable to speak.  Strep throat PCR was positive.  CT neck with a small peritonsillar abscess and bilateral reactive cervical lymph nodes.  ENT was also consulted and they do not think that he needs I&D as abscesses were very small. He was given IV clindamycin and continued with IV Unasyn along with steroid. Significant improvement in his symptoms when seen today.  ENT is recommending discharge tomorrow on Augmentin with a steroid taper and outpatient follow-up for further recommendations or elective tonsillectomy.  Incidental finding of enlarged, heterogeneous thyroid gland, particularly on left which will need outpatient nonemergent thyroid ultrasound for further evaluation.

## 2021-02-22 NOTE — ED Notes (Signed)
'  I feel hot' temp 98.3

## 2021-02-22 NOTE — ED Provider Notes (Addendum)
CT consistent with peritonsillar abscess.  Will start patient on IV clindamycin and get patient admitted to the hospital for IV antibiotics and ENT evaluation.  Patient complaining of severe pain will treat with Toradol.  He already received Decadron    I have personally reviewed the images performed during this visit and I agree with the Radiologist's read.   Interpretation by Radiologist:  CT Soft Tissue Neck W Contrast  Result Date: 02/22/2021 CLINICAL DATA:  Right-sided neck swelling.  Streptococcal infection. EXAM: CT NECK WITH CONTRAST TECHNIQUE: Multidetector CT imaging of the neck was performed using the standard protocol following the bolus administration of intravenous contrast. CONTRAST:  5mL OMNIPAQUE IOHEXOL 300 MG/ML  SOLN COMPARISON:  None. FINDINGS: PHARYNX AND LARYNX: Right palatine tonsil is enlarged. There is a peritonsillar fluid collection that measures 10 x 8 mm. Normal epiglottis. Larynx is clear. No retropharyngeal abscess. SALIVARY GLANDS: Normal parotid, submandibular and sublingual glands. THYROID: Enlarged thyroid gland, particularly on the left. Mild heterogeneity. LYMPH NODES: Bilateral reactive cervical lymph nodes. VASCULAR: Major cervical vessels are patent. LIMITED INTRACRANIAL: Normal. VISUALIZED ORBITS: Normal. MASTOIDS AND VISUALIZED PARANASAL SINUSES: Chronic ethmoid and maxillary sinus mucosal thickening. SKELETON: No bony spinal canal stenosis. No lytic or blastic lesions. UPPER CHEST: Clear. OTHER: None. IMPRESSION: 1. Right palatine tonsillitis with 10 x 8 mm peritonsillar abscess. 2. Bilateral reactive cervical lymph nodes. 3. Enlarged, heterogeneous thyroid gland, particularly on the left. Recommend thyroid ultrasound (ref: J Am Coll Radiol. 2015 Feb;12(2): 143-50). Electronically Signed   By: Deatra Robinson M.D.   On: 02/22/2021 02:00      Nita Sickle, MD 02/22/21 Delene Loll, [place], MD 02/22/21 Emeline Darling

## 2021-02-22 NOTE — H&P (Signed)
[place]   PATIENT NAME: Micheal Baxter    MR#:  [id number]  DATE OF BIRTH:  1971/08/29  DATE OF ADMISSION:  02/21/2021  PRIMARY CARE PHYSICIAN: Patient, No Pcp Per   Patient is coming from: Home.  REQUESTING/REFERRING PHYSICIAN: Nita Sickle, MD   CHIEF COMPLAINT:   Chief Complaint  Patient presents with  . Sore Throat    HISTORY OF PRESENT ILLNESS:  Micheal Baxter is a 50 y.o. African-American male with history of hypertension who presented to the emergency room with acute onset of worsening right-sided sore throat.  A few days ago he thought he had strep throat.  His mother is a Engineer, civil (consulting) and she thought he needed to get his tonsils out but he never did.Marland Kitchen  He has been taking over-the-counter medications to help with his sore throat but it has been getting significantly worse with constant pain on the right side.  It has been painful to talk and to swallow.  He admitted to fever and chills.  It has been radiating to his right ear with subsequent right earache.  It has been associated with right-sided neck swelling.  No cough or wheezing.  No chest pain or palpitations.  No nausea or vomiting.Marland Kitchen He said that he ran out of his blood pressure medications and has not been taking it for a while.  He does not recall the name of the medication.  No headache or dizziness or blurred vision.  No dysuria, oliguria or hematuria or flank pain.  ED Course: When he came to the ER blood pressure was 156/120 with a temperature of 99.8 and otherwise normal vital signs and later heart rate was 95..  Labs revealed CBC with WBC of 14.7 with neutrophilia and CMP was unremarkable.  Influenza antigens came back negative and COVID-19 PCR was negative.  Group A strep by PCR came back positive.  Monospot was negative.  The patient had COVID-19 in January 2022. Imaging: Soft tissue neck CT revealed the following : 1. Right palatine tonsillitis with 10 x 8 mm peritonsillar abscess. 2. Bilateral  reactive cervical lymph nodes. 3. Enlarged, heterogeneous thyroid gland, particularly on the left. Recommend thyroid ultrasound.  The patient was given 600 mg of IV clindamycin, 10 mg of IV Decadron and 15 mg of IV Toradol.  He will be admitted to a medical bed for further evaluation and management. PAST MEDICAL HISTORY:  Essential hypertension  PAST SURGICAL HISTORY:  History reviewed. No pertinent surgical history.  He denies any previous surgeries.  SOCIAL HISTORY:   Social History   Tobacco Use  . Smoking status: Never Smoker  . Smokeless tobacco: Never Used  Substance Use Topics  . Alcohol use: Not Currently    FAMILY HISTORY:   Family History  Problem Relation Age of Onset  . Healthy Mother   . Healthy Father   Positive for diabetes mellitus in his grandfather and grandmother.  DRUG ALLERGIES:  No Known Allergies  REVIEW OF SYSTEMS:   ROS As per history of present illness. All pertinent systems were reviewed above. Constitutional, HEENT, cardiovascular, respiratory, GI, GU, musculoskeletal, neuro, psychiatric, endocrine, integumentary and hematologic systems were reviewed and are otherwise negative/unremarkable except for positive findings mentioned above in the HPI.   MEDICATIONS AT HOME:   Prior to Admission medications   Medication Sig Start Date End Date Taking? Authorizing Provider  benzonatate (TESSALON) 100 MG capsule Take 2 capsules (200 mg total) by mouth every 8 (eight) hours. Patient not taking:  No sig reported 10/16/20   Becky Augusta, NP  methocarbamol (ROBAXIN) 500 MG tablet Take 1 tablet (500 mg total) by mouth every 8 (eight) hours as needed for muscle spasms. Patient not taking: No sig reported 11/13/20   Delton See, MD  naproxen (NAPROSYN) 500 MG tablet Take 1 tablet (500 mg total) by mouth 2 (two) times daily with a meal. Patient not taking: No sig reported 11/13/20   Delton See, MD  promethazine-dextromethorphan (PROMETHAZINE-DM)  6.25-15 MG/5ML syrup Take 5 mLs by mouth 4 (four) times daily as needed. Patient not taking: No sig reported 10/16/20   Becky Augusta, NP      VITAL SIGNS:  Blood pressure (!) 128/91, pulse 91, temperature 98.3 F (36.8 C), temperature source Oral, resp. rate 18, height 5\' 10"  (1.778 m), weight 87.1 kg, SpO2 96 %.  PHYSICAL EXAMINATION:  Physical Exam  GENERAL:  50 y.o.-year-old African-American male patient lying in the bed with no acute distress.  EYES: Pupils equal, round, reactive to light and accommodation. No scleral icterus. Extraocular muscles intact.  HEENT: Head atraumatic, normocephalic. Oropharynx with right erythematous tonsillar swelling with significant asymmetry and nasopharynx clear.  NECK:  Supple, no jugular venous distention. No thyroid enlargement.  He has a palpable right tender tonsillar shoddy lymphadenopathy.  LUNGS: Normal breath sounds bilaterally, no wheezing, rales,rhonchi or crepitation. No use of accessory muscles of respiration.  CARDIOVASCULAR: Regular rate and rhythm, S1, S2 normal. No murmurs, rubs, or gallops.  ABDOMEN: Soft, nondistended, nontender. Bowel sounds present. No organomegaly or mass.  EXTREMITIES: No pedal edema, cyanosis, or clubbing.  NEUROLOGIC: Cranial nerves II through XII are intact. Muscle strength 5/5 in all extremities. Sensation intact. Gait not checked.  PSYCHIATRIC: The patient is alert and oriented x 3.  Normal affect and good eye contact. SKIN: No obvious rash, lesion, or ulcer.   LABORATORY PANEL:   CBC Recent Labs  Lab 02/21/21 2200  WBC 14.7*  HGB 14.7  HCT 42.2  PLT 235   ------------------------------------------------------------------------------------------------------------------  Chemistries  Recent Labs  Lab 02/22/21 0043  NA 140  K 3.6  CL 103  CO2 27  GLUCOSE 109*  BUN 11  CREATININE 0.96  CALCIUM 8.8*  AST 15  ALT 13  ALKPHOS 63  BILITOT 1.1    ------------------------------------------------------------------------------------------------------------------  Cardiac Enzymes No results for input(s): TROPONINI in the last 168 hours. ------------------------------------------------------------------------------------------------------------------  RADIOLOGY:  CT Soft Tissue Neck W Contrast  Result Date: 02/22/2021 CLINICAL DATA:  Right-sided neck swelling.  Streptococcal infection. EXAM: CT NECK WITH CONTRAST TECHNIQUE: Multidetector CT imaging of the neck was performed using the standard protocol following the bolus administration of intravenous contrast. CONTRAST:  75mL OMNIPAQUE IOHEXOL 300 MG/ML  SOLN COMPARISON:  None. FINDINGS: PHARYNX AND LARYNX: Right palatine tonsil is enlarged. There is a peritonsillar fluid collection that measures 10 x 8 mm. Normal epiglottis. Larynx is clear. No retropharyngeal abscess. SALIVARY GLANDS: Normal parotid, submandibular and sublingual glands. THYROID: Enlarged thyroid gland, particularly on the left. Mild heterogeneity. LYMPH NODES: Bilateral reactive cervical lymph nodes. VASCULAR: Major cervical vessels are patent. LIMITED INTRACRANIAL: Normal. VISUALIZED ORBITS: Normal. MASTOIDS AND VISUALIZED PARANASAL SINUSES: Chronic ethmoid and maxillary sinus mucosal thickening. SKELETON: No bony spinal canal stenosis. No lytic or blastic lesions. UPPER CHEST: Clear. OTHER: None. IMPRESSION: 1. Right palatine tonsillitis with 10 x 8 mm peritonsillar abscess. 2. Bilateral reactive cervical lymph nodes. 3. Enlarged, heterogeneous thyroid gland, particularly on the left. Recommend thyroid ultrasound (ref: J Am Coll Radiol. 2015 Feb;12(2): 143-50). Electronically Signed  By: Deatra Robinson M.D.   On: 02/22/2021 02:00      IMPRESSION AND PLAN:  Active Problems:   Peritonsillar abscess  1.  Acute palatine tonsillitis with streptococcal pharyngitis and right peritonsillar abscess. -The patient will be admitted  to a medical bed. -Pain management will be provided with as needed IV Toradol. -He will be hydrated with IV normal saline. -We will continue him on IV Unasyn as well as IV Decadron. -I discussed this case with Dr. Geanie Logan who agreed with current management and recommended outpatient follow-up with clinical improvement during his hospitalization. -At this time his abscess is too small to drain.  2.  Mild sepsis secondary to #1.  This is manifested by leukocytosis and tachycardia. -Management as above. -We will follow blood cultures. -The patient will be hydrated with IV normal saline.  3.  Hypertensive urgency secondary to noncompliance and pain. -He will be placed on as needed IV labetalol. -Pain management will be optimized. -We will start him on p.o. Norvasc.  DVT prophylaxis: Lovenox.   Code Status: full code. Family Communication:  The plan of care was discussed in details with the patient (and family). I answered all questions. The patient agreed to proceed with the above mentioned plan. Further management will depend upon hospital course. Disposition Plan: Back to previous home environment Consults called: none.  ENT recommendation was obtained as mentioned above.   All the records are reviewed and case discussed with ED provider.  Status is: Inpatient  Remains inpatient appropriate because:Ongoing active pain requiring inpatient pain management, Ongoing diagnostic testing needed not appropriate for outpatient work up, Unsafe d/c plan, IV treatments appropriate due to intensity of illness or inability to take PO and Inpatient level of care appropriate due to severity of illness   Dispo: The patient is from: Home              Anticipated d/c is to: Home              Patient currently is not medically stable to d/c.   Difficult to place patient No  TOTAL TIME TAKING CARE OF THIS PATIENT: 55 minutes.    Hannah Beat M.D on 02/22/2021 at 3:59 AM  [facility]    From 7 PM-7 AM, contact night-coverage [email or web address]  CC: Primary care physician; Patient, No Pcp Per

## 2021-02-22 NOTE — ED Notes (Signed)
Pt provided with sherbet and apple juice

## 2021-02-22 NOTE — Progress Notes (Signed)
Received order from Dr. Nelson Chimes to advance diet to [facility] diet.

## 2021-02-22 NOTE — Consult Note (Signed)
Micheal Baxter, Micheal Baxter [id number] June 01, 1971 Sandi Mealy, MD  Reason for Consult: peritonsillar abscess Requesting Physician: Arnetha Courser, MD Consulting Physician: Sandi Mealy, MD  HPI: This 50 y.o. year old male was admitted on 02/21/2021 for Peritonsillar abscess [J36] Strep pharyngitis [J02.0]. He has a history of recurrent strep throat, maybe twice yearly for a number of years. This episode was worse with difficulty swallowing and tightness when breathing. He was noted to have a small 8-10 mm right peritonsillar abscess on CT. Symptoms have improved dramatically on antibiotics and with steroids and he notes the swelling has gone down, now able to swallow.   Medications:  Current Facility-Administered Medications  Medication Dose Route Frequency Provider Last Rate Last Admin  . 0.9 %  sodium chloride infusion   Intravenous Continuous Mansy, Jan A, MD 100 mL/hr at 02/22/21 8921 Infusion Verify at 02/22/21 [phone]  . acetaminophen (TYLENOL) tablet 650 mg  650 mg Oral Q6H PRN Mansy, Jan A, MD       Or  . acetaminophen (TYLENOL) suppository 650 mg  650 mg Rectal Q6H PRN Mansy, Jan A, MD      . Ampicillin-Sulbactam (UNASYN) 3 g in sodium chloride 0.9 % 100 mL IVPB  3 g Intravenous Q6H Mansy, Jan A, MD 200 mL/hr at 02/22/21 0936 3 g at 02/22/21 0936  . dexamethasone (DECADRON) injection 4 mg  4 mg Intravenous Q12H Mansy, Jan A, MD   4 mg at 02/22/21 7408  . enoxaparin (LOVENOX) injection 40 mg  40 mg Subcutaneous Q24H Mansy, Jan A, MD   40 mg at 02/22/21 0933  . ketorolac (TORADOL) 15 MG/ML injection 15 mg  15 mg Intravenous Q6H PRN Mansy, Jan A, MD      . magnesium hydroxide (MILK OF MAGNESIA) suspension 30 mL  30 mL Oral Daily PRN Mansy, Jan A, MD      . menthol-cetylpyridinium (CEPACOL) lozenge 3 mg  1 lozenge Oral PRN Mansy, Jan A, MD      . morphine 2 MG/ML injection 2 mg  2 mg Intravenous Q4H PRN Mansy, Jan A, MD      . ondansetron [facility]) tablet 4 mg  4 mg Oral Q6H PRN Mansy, Jan A, MD        Or  . ondansetron [facility]) injection 4 mg  4 mg Intravenous Q6H PRN Mansy, Jan A, MD      . potassium chloride (KLOR-CON) packet 40 mEq  40 mEq Oral Once Mansy, Jan A, MD      . traZODone (DESYREL) tablet 25 mg  25 mg Oral QHS PRN Mansy, Vernetta Honey, MD      .  Medications Prior to Admission  Medication Sig Dispense Refill  . benzonatate (TESSALON) 100 MG capsule Take 2 capsules (200 mg total) by mouth every 8 (eight) hours. (Patient not taking: No sig reported) 21 capsule 0  . methocarbamol (ROBAXIN) 500 MG tablet Take 1 tablet (500 mg total) by mouth every 8 (eight) hours as needed for muscle spasms. (Patient not taking: No sig reported) 20 tablet 0  . naproxen (NAPROSYN) 500 MG tablet Take 1 tablet (500 mg total) by mouth 2 (two) times daily with a meal. (Patient not taking: No sig reported) 20 tablet 0  . promethazine-dextromethorphan (PROMETHAZINE-DM) 6.25-15 MG/5ML syrup Take 5 mLs by mouth 4 (four) times daily as needed. (Patient not taking: No sig reported) 118 mL 0    Allergies: No Known Allergies  PMH: History reviewed. No pertinent past medical history.  Fam Hx:  Family History  Problem Relation Age of Onset  . Healthy Mother   . Healthy Father     Soc Hx:  Social History   Socioeconomic History  . Marital status: Single    Spouse name: Not on file  . Number of children: Not on file  . Years of education: Not on file  . Highest education level: Not on file  Occupational History  . Not on file  Tobacco Use  . Smoking status: Never Smoker  . Smokeless tobacco: Never Used  Substance and Sexual Activity  . Alcohol use: Not Currently  . Drug use: Not Currently    Types: Marijuana  . Sexual activity: Yes    Birth control/protection: None  Other Topics Concern  . Not on file  Social History Narrative  . Not on file   Social Determinants of Health   Financial Resource Strain: Not on file  Food Insecurity: Not on file  Transportation Needs: Not on file  Physical  Activity: Not on file  Stress: Not on file  Social Connections: Not on file  Intimate Partner Violence: Not on file    PSH: History reviewed. No pertinent surgical history.. Procedures since admission: No admission procedures for hospital encounter.  ROS: Review of systems normal other than 12 systems except per HPI.  PHYSICAL EXAM  Vitals: Blood pressure (!) 149/95, pulse 77, temperature 97.6 F (36.4 C), temperature source Oral, resp. rate 17, height 5\' 10"  (1.778 m), weight 87.1 kg, SpO2 97 %.. General: Well-developed, Well-nourished in no acute distress Mood: Mood and affect well adjusted, pleasant and cooperative. Orientation: Grossly alert and oriented. Vocal Quality: No hoarseness. Communicates verbally. head and Face: NCAT. No facial asymmetry. No visible skin lesions. No significant facial scars. No tenderness with sinus percussion. Facial strength normal and symmetric. Ears: External ears with normal landmarks, no lesions.  Hearing: Speech reception grossly normal. Nose: External nose normal with midline dorsum and no lesions or deformity. Nasal Cavity reveals essentially midline septum with normal inferior turbinates. Moderate mucosal congestion without purulence.  Oral Cavity/ Oropharynx: Lips are normal with no lesions. Teeth no frank dental caries. Gingiva healthy with no lesions or gingivitis. Oropharynx shows 2+ tonsils with right tonsil slightly more swollen but no significant edema of the adjacent soft palate and no uvular deviation. Indirect Laryngoscopy/Nasopharyngoscopy: Visualization of the larynx, hypopharynx and nasopharynx is not possible in this setting with routine examination. Neck: Supple and symmetric with no palpable masses, tenderness or crepitance. The trachea is midline. Thyroid gland is soft, nontender and symmetric with no masses or enlargement. Parotid and submandibular glands are soft, nontender and symmetric, without masses. Lymphatic: Cervical lymph  nodes notable for mild JD adenopathy without crepitance Respiratory: Normal respiratory effort without labored breathing or stridor Cardiovascular: Carotid pulse shows regular rate and rhythm Neurologic: Cranial Nerves II through XII are grossly intact. Eyes: Gaze and Ocular Motility are grossly normal. PERRLA. No visible nystagmus.  MEDICAL DECISION MAKING: Data Review:  Results for orders placed or performed during the hospital encounter of 02/21/21 (from the past 48 hour(s))  CBC with Differential     Status: Abnormal   Collection Time: 02/21/21 10:00 PM  Result Value Ref Range   WBC 14.7 (H) 4.0 - 10.5 K/uL   RBC 5.00 4.22 - 5.81 MIL/uL   Hemoglobin 14.7 13.0 - 17.0 g/dL   HCT 02/23/21 08.6 - 57.8 %   MCV 84.4 80.0 - 100.0 fL   MCH 29.4 26.0 - 34.0 pg   MCHC 34.8  30.0 - 36.0 g/dL   RDW 22.0 25.4 - 27.0 %   Platelets 235 150 - 400 K/uL   nRBC 0.0 0.0 - 0.2 %   Neutrophils Relative % 79 %   Neutro Abs 11.5 (H) 1.7 - 7.7 K/uL   Lymphocytes Relative 10 %   Lymphs Abs 1.5 0.7 - 4.0 K/uL   Monocytes Relative 10 %   Monocytes Absolute 1.5 (H) 0.1 - 1.0 K/uL   Eosinophils Relative 1 %   Eosinophils Absolute 0.2 0.0 - 0.5 K/uL   Basophils Relative 0 %   Basophils Absolute 0.1 0.0 - 0.1 K/uL   Immature Granulocytes 0 %   Abs Immature Granulocytes 0.05 0.00 - 0.07 K/uL    Comment: Performed at [facility], [address] [place], [place] [address]  Group A Strep by PCR [facility] Only)     Status: Abnormal   Collection Time: 02/21/21 10:00 PM   Specimen: Throat; Sterile Swab  Result Value Ref Range   Group A Strep by PCR DETECTED (A) NOT DETECTED    Comment: Performed at [facility], [address] [place], [place] [address]  Mononucleosis screen     Status: None   Collection Time: 02/21/21 10:00 PM  Result Value Ref Range   Mono Screen NEGATIVE NEGATIVE    Comment: Performed at [facility], [address] [place], [place] [address]  Comprehensive  metabolic panel     Status: Abnormal   Collection Time: 02/22/21 12:43 AM  Result Value Ref Range   Sodium 140 135 - 145 mmol/L   Potassium 3.6 3.5 - 5.1 mmol/L   Chloride 103 98 - 111 mmol/L   CO2 27 22 - 32 mmol/L   Glucose, Bld 109 (H) 70 - 99 mg/dL    Comment: Glucose reference range applies only to samples taken after fasting for at least 8 hours.   BUN 11 6 - 20 mg/dL   Creatinine, Ser 0.73 0.61 - 1.24 mg/dL   Calcium 8.8 (L) 8.9 - 10.3 mg/dL   Total Protein 8.0 6.5 - 8.1 g/dL   Albumin 3.7 3.5 - 5.0 g/dL   AST 15 15 - 41 U/L   ALT 13 0 - 44 U/L   Alkaline Phosphatase 63 38 - 126 U/L   Total Bilirubin 1.1 0.3 - 1.2 mg/dL   GFR, Estimated >71 >06 mL/min    Comment: (NOTE) Calculated using the CKD-EPI Creatinine Equation (2021)    Anion gap 10 5 - 15    Comment: Performed at [facility], [address] [place], [place] [address]  Resp Panel by RT-PCR (Flu A&B, Covid) Nasopharyngeal Swab     Status: None   Collection Time: 02/22/21  2:14 AM   Specimen: Nasopharyngeal Swab; Nasopharyngeal(NP) swabs in vial transport medium  Result Value Ref Range   SARS Coronavirus 2 by RT PCR NEGATIVE NEGATIVE    Comment: (NOTE) SARS-CoV-2 target nucleic acids are NOT DETECTED.  The SARS-CoV-2 RNA is generally detectable in upper respiratory specimens during the acute phase of infection. The lowest concentration of SARS-CoV-2 viral copies this assay can detect is 138 copies/mL. A negative result does not preclude SARS-Cov-2 infection and should not be used as the sole basis for treatment or other patient management decisions. A negative result may occur with  improper specimen collection/handling, submission of specimen other than nasopharyngeal swab, presence of viral mutation(s) within the areas targeted by this assay, and inadequate number of viral copies(<138 copies/mL). A negative result  must be combined with clinical observations, patient history, and  epidemiological information. The expected result is Negative.  Fact Sheet for Patients:  https://www.fda.gov/media/152166/download  Fact Sheet for Healthcare Providers:  SeriousBroker.ithttps://www.fda.gov/media/152162/download  This test is no t yet approved or cleared by the [place] FDA and  has been authorized for detection and/or diagnosis of SARS-CoV-2 by FDA under an Emergency Use Authorization (EUA). This EUA will remain  in effect (meaning [email or web address] test can be used) for the duration of the COVID-19 declaration under Section 564(b)(1) of the Act, 21 U.S.C.section 360bbb-3(b)(1), unless the authorization is terminated  or revoked sooner.       Influenza A by PCR NEGATIVE NEGATIVE   Influenza B by PCR NEGATIVE NEGATIVE    Comment: (NOTE) The Xpert Xpress SARS-CoV-2/FLU/RSV plus assay is intended as an aid in the diagnosis of influenza from Nasopharyngeal swab specimens and should not be used as a sole basis for treatment. Nasal washings and aspirates are unacceptable for Xpert Xpress SARS-CoV-2/FLU/RSV testing.  Fact Sheet for Patients: BloggerCourse.comhttps://www.fda.gov/media/152166/download  Fact Sheet for Healthcare Providers: SeriousBroker.ithttps://www.fda.gov/media/152162/download  This test is not yet approved or cleared by the [place] FDA and has been authorized for detection and/or diagnosis of SARS-CoV-2 by FDA under an Emergency Use Authorization (EUA). This EUA will remain in effect (meaning this test can be used) for the duration of the COVID-19 declaration under Section 564(b)(1) of the Act, 21 U.S.C. section 360bbb-3(b)(1), unless the authorization is terminated or revoked.  Performed at [facility], [address] [place], [place] [address]   HIV Antibody (routine testing w rflx)     Status: None   Collection Time: 02/22/21  5:22 AM  Result Value Ref Range   HIV Screen 4th Generation wRfx Non Reactive Non Reactive    Comment: Performed at [facility], [address] [place], [place] [address]  Basic metabolic panel     Status: Abnormal   Collection Time: 02/22/21  5:22 AM  Result Value Ref Range   Sodium 138 135 - 145 mmol/L   Potassium 3.8 3.5 - 5.1 mmol/L   Chloride 100 98 - 111 mmol/L   CO2 27 22 - 32 mmol/L   Glucose, Bld 176 (H) 70 - 99 mg/dL    Comment: Glucose reference range applies only to samples taken after fasting for at least 8 hours.   BUN 13 6 - 20 mg/dL   Creatinine, Ser 3.241.23 0.61 - 1.24 mg/dL   Calcium 9.2 8.9 - 40.110.3 mg/dL   GFR, Estimated >02>60 >72>60 mL/min    Comment: (NOTE) Calculated using the CKD-EPI Creatinine Equation (2021)    Anion gap 11 5 - 15    Comment: Performed at [facility], [address] [place], [place] [address]  CBC     Status: Abnormal   Collection Time: 02/22/21  5:22 AM  Result Value Ref Range   WBC 15.7 (H) 4.0 - 10.5 K/uL   RBC 4.70 4.22 - 5.81 MIL/uL   Hemoglobin 13.6 13.0 - 17.0 g/dL   HCT 40.339.5 47.439.0 - 25.952.0 %   MCV 84.0 80.0 - 100.0 fL   MCH 28.9 26.0 - 34.0 pg   MCHC 34.4 30.0 - 36.0 g/dL   RDW 56.312.0 87.511.5 - 64.315.5 %   Platelets 269 150 - 400 K/uL   nRBC 0.0 0.0 - 0.2 %    Comment: Performed at [facility], [address] [place], [place] [address]  . CT Soft Tissue Neck W Contrast  Result Date: 02/22/2021 CLINICAL DATA:  Right-sided neck swelling.  Streptococcal infection. EXAM: CT NECK WITH CONTRAST TECHNIQUE: Multidetector CT imaging of the neck was performed using the standard protocol following the bolus administration of intravenous contrast. CONTRAST:  75mL OMNIPAQUE IOHEXOL 300 MG/ML  SOLN COMPARISON:  None. FINDINGS: PHARYNX AND LARYNX: Right palatine tonsil is enlarged. There is a peritonsillar fluid collection that measures 10 x 8 mm. Normal epiglottis. Larynx is clear. No retropharyngeal abscess. SALIVARY GLANDS: Normal parotid, submandibular and sublingual glands. THYROID: Enlarged thyroid gland, particularly on the left. Mild heterogeneity. LYMPH NODES: Bilateral  reactive cervical lymph nodes. VASCULAR: Major cervical vessels are patent. LIMITED INTRACRANIAL: Normal. VISUALIZED ORBITS: Normal. MASTOIDS AND VISUALIZED PARANASAL SINUSES: Chronic ethmoid and maxillary sinus mucosal thickening. SKELETON: No bony spinal canal stenosis. No lytic or blastic lesions. UPPER CHEST: Clear. OTHER: None. IMPRESSION: 1. Right palatine tonsillitis with 10 x 8 mm peritonsillar abscess. 2. Bilateral reactive cervical lymph nodes. 3. Enlarged, heterogeneous thyroid gland, particularly on the left. Recommend thyroid ultrasound (ref: J Am Coll Radiol. 2015 Feb;12(2): 143-50). Electronically Signed   By: Deatra [place] M.D.   On: 02/22/2021 02:00  .   ASSESSMENT: Right peritonsillar abscess, resolving with medical management. This was small and no need for I&D given the improvement. He does have some h/o recurrent tonsillitis, and future tonsillectomy is an option  PLAN: OK for d/c tomorrow on Augmentin and a prednisone taper. F/u in my office in 1 week. Can then further discuss future tonsillectomy    Sandi Mealy, MD 02/22/2021 1:41 PM

## 2021-02-23 DIAGNOSIS — J020 Streptococcal pharyngitis: Secondary | ICD-10-CM

## 2021-02-23 MED ORDER — LOSARTAN POTASSIUM 50 MG PO TABS
50.0000 mg | ORAL_TABLET | Freq: Every day | ORAL | Status: DC
  Administered 2021-02-23: 50 mg via ORAL
  Filled 2021-02-23: qty 1

## 2021-02-23 MED ORDER — PREDNISONE 10 MG (21) PO TBPK: ORAL_TABLET | ORAL | 0 refills | Status: DC

## 2021-02-23 MED ORDER — MENTHOL 3 MG MT LOZG: 1.0000 | LOZENGE | OROMUCOSAL | 12 refills | Status: DC | PRN

## 2021-02-23 MED ORDER — LOSARTAN POTASSIUM 50 MG PO TABS: 50.0000 mg | ORAL_TABLET | Freq: Every day | ORAL | 1 refills | Status: DC

## 2021-02-23 MED ORDER — AMOXICILLIN-POT CLAVULANATE 875-125 MG PO TABS: 1.0000 | ORAL_TABLET | Freq: Two times a day (BID) | ORAL | 0 refills | Status: AC

## 2021-02-23 NOTE — Discharge Summary (Signed)
Physician Discharge Summary  Micheal Baxter XTG:[id number] DOB: [birth year] DOA: 02/21/2021  PCP: Patient, No Pcp Per  Admit date: 02/21/2021 Discharge date: 02/23/2021  Admitted From: Home Disposition: Home  Recommendations for Outpatient Follow-up:  1. Follow up with PCP in 1-2 weeks 2. Follow-up with ENT 3. Please obtain BMP/CBC in one week 4. Please follow up on the following pending results: None  Home Health: No Equipment/Devices: None Discharge Condition: Stable CODE STATUS: Full Diet recommendation: Heart Healthy   Brief/Interim Summary: Micheal Baxter a 50 y.o.African-American malewithhistory of hypertensionwho presented to the emergency room with acute onset of worsening right-sided sore throat, difficulty swallowing and unable to speak.  Strep throat PCR was positive.  CT neck with a small peritonsillar abscess and bilateral reactive cervical lymph nodes.  ENT was also consulted and they do not think that he needs I&D as abscesses were very small. He was given IV clindamycin and continued with IV Unasyn along with steroid. Significant improvement in his symptoms, he was discharged on 5 more days of Augmentin and a tapering dose of steroid and will follow up with ENT as an outpatient for further recommendations and a possible elective tonsillectomy.  Incidental finding of enlarged, heterogeneous thyroid gland, particularly on left which will need outpatient nonemergent thyroid ultrasound for further evaluation.  He will continue with his home meds and follow-up with his own providers.  Discharge Diagnoses:  Active Problems:   Peritonsillar abscess   Strep pharyngitis   Discharge Instructions  Discharge Instructions    Diet - low sodium heart healthy   Complete by: As directed    Discharge instructions   Complete by: As directed    It was pleasure taking care of you. You are being given antibiotics for 5 more days, please take it as directed. Your ENT  specialist also recommend a tapering dose of steroid, you will take 5 tablets tomorrow, decrease 1 tablet daily till you reach to only 1 tablet a day and then stop it. Your blood pressure was elevated and you are being started on a new medicine called losartan, please follow-up with your primary care provider closely for optimum control. Follow-up with your ENT specialist for further management of your recurrent tonsillitis.   Increase activity slowly   Complete by: As directed      Allergies as of 02/23/2021   No Known Allergies     Medication List    STOP taking these medications   benzonatate 100 MG capsule Commonly known as: TESSALON   methocarbamol 500 MG tablet Commonly known as: Robaxin   naproxen 500 MG tablet Commonly known as: Naprosyn   promethazine-dextromethorphan 6.25-15 MG/5ML syrup Commonly known as: PROMETHAZINE-DM     TAKE these medications   amoxicillin-clavulanate 875-125 MG tablet Commonly known as: AUGMENTIN Take 1 tablet by mouth 2 (two) times daily for 5 days.   losartan 50 MG tablet Commonly known as: COZAAR Take 1 tablet (50 mg total) by mouth daily.   menthol-cetylpyridinium 3 MG lozenge Commonly known as: CEPACOL Take 1 lozenge (3 mg total) by mouth as needed for sore throat.   predniSONE 10 MG (21) Tbpk tablet Commonly known as: STERAPRED UNI-PAK 21 TAB Take 5 tablets on day 1, decrease by 1 tablet every day till you a at 1 tablet and then stop       Follow-up Information    Geanie Logan, MD. Go on 03/02/2021.   Specialty: Otolaryngology Why: they will call the patient with appointment Contact information: [address]  [address] [place] [address] [phone]              No Known Allergies  Consultations:  ENT  Procedures/Studies: CT Soft Tissue Neck W Contrast  Result Date: 02/22/2021 CLINICAL DATA:  Right-sided neck swelling.  Streptococcal infection. EXAM: CT NECK WITH CONTRAST TECHNIQUE:  Multidetector CT imaging of the neck was performed using the standard protocol following the bolus administration of intravenous contrast. CONTRAST:  45mL OMNIPAQUE IOHEXOL 300 MG/ML  SOLN COMPARISON:  None. FINDINGS: PHARYNX AND LARYNX: Right palatine tonsil is enlarged. There is a peritonsillar fluid collection that measures 10 x 8 mm. Normal epiglottis. Larynx is clear. No retropharyngeal abscess. SALIVARY GLANDS: Normal parotid, submandibular and sublingual glands. THYROID: Enlarged thyroid gland, particularly on the left. Mild heterogeneity. LYMPH NODES: Bilateral reactive cervical lymph nodes. VASCULAR: Major cervical vessels are patent. LIMITED INTRACRANIAL: Normal. VISUALIZED ORBITS: Normal. MASTOIDS AND VISUALIZED PARANASAL SINUSES: Chronic ethmoid and maxillary sinus mucosal thickening. SKELETON: No bony spinal canal stenosis. No lytic or blastic lesions. UPPER CHEST: Clear. OTHER: None. IMPRESSION: 1. Right palatine tonsillitis with 10 x 8 mm peritonsillar abscess. 2. Bilateral reactive cervical lymph nodes. 3. Enlarged, heterogeneous thyroid gland, particularly on the left. Recommend thyroid ultrasound (ref: J Am Coll Radiol. 2015 Feb;12(2): 143-50). Electronically Signed   By: Deatra Robinson M.D.   On: 02/22/2021 02:00     Subjective: Patient was seen and examined today.  No new complaint.  Able to swallow well.  Discharge Exam: Vitals:   02/23/21 0434 02/23/21 0821  BP: (!) 131/101 (!) 141/102  Pulse: 67 63  Resp: 18 18  Temp: 97.6 F (36.4 C) (!) 97.5 F (36.4 C)  SpO2: 96% 100%   Vitals:   02/22/21 2112 02/23/21 0105 02/23/21 0434 02/23/21 0821  BP: (!) 139/104 (!) 140/98 (!) 131/101 (!) 141/102  Pulse: 95 96 67 63  Resp: 20 20 18 18   Temp: 98.6 F (37 C) 98.4 F (36.9 C) 97.6 F (36.4 C) (!) 97.5 F (36.4 C)  TempSrc: Oral  Oral Oral  SpO2: 97% 97% 96% 100%  Weight:      Height:        General: Pt is alert, awake, not in acute distress Cardiovascular: RRR, S1/S2 +,  no rubs, no gallops Respiratory: CTA bilaterally, no wheezing, no rhonchi Abdominal: Soft, NT, ND, bowel sounds + Extremities: no edema, no cyanosis   The results of significant diagnostics from this hospitalization (including imaging, microbiology, ancillary and laboratory) are listed below for reference.    Microbiology: Recent Results (from the past 240 hour(s))  Group A Strep by PCR ([facility] Only)     Status: Abnormal   Collection Time: 02/21/21 10:00 PM   Specimen: Throat; Sterile Swab  Result Value Ref Range Status   Group A Strep by PCR DETECTED (A) NOT DETECTED Final    Comment: Performed at [facility], [address], [place], [place]  Resp Panel by RT-PCR (Flu A&B, Covid) Nasopharyngeal Swab     Status: None   Collection Time: 02/22/21  2:14 AM   Specimen: Nasopharyngeal Swab; Nasopharyngeal(NP) swabs in vial transport medium  Result Value Ref Range Status   SARS Coronavirus 2 by RT PCR NEGATIVE NEGATIVE Final    Comment: (NOTE) SARS-CoV-2 target nucleic acids are NOT DETECTED.  The SARS-CoV-2 RNA is generally detectable in upper respiratory specimens during the acute phase of infection. The lowest concentration of SARS-CoV-2 viral copies this assay can detect is 138 copies/mL. A negative  result does not preclude SARS-Cov-2 infection and should not be used as the sole basis for treatment or other patient management decisions. A negative result may occur with  improper specimen collection/handling, submission of specimen other than nasopharyngeal swab, presence of viral mutation(s) within the areas targeted by this assay, and inadequate number of viral copies(<138 copies/mL). A negative result must be combined with clinical observations, patient history, and epidemiological information. The expected result is Negative.  Fact Sheet for Patients:  BloggerCourse.com  Fact Sheet for Healthcare Providers:   SeriousBroker.it  This test is no t yet approved or cleared by the [place] FDA and  has been authorized for detection and/or diagnosis of SARS-CoV-2 by FDA under an Emergency Use Authorization (EUA). This EUA will remain  in effect (meaning this test can be used) for the duration of the COVID-19 declaration under Section 564(b)(1) of the Act, 21 U.S.C.section 360bbb-3(b)(1), unless the authorization is terminated  or revoked sooner.       Influenza A by PCR NEGATIVE NEGATIVE Final   Influenza B by PCR NEGATIVE NEGATIVE Final    Comment: (NOTE) The Xpert Xpress SARS-CoV-2/FLU/RSV plus assay is intended as an aid in the diagnosis of influenza from Nasopharyngeal swab specimens and should not be used as a sole basis for treatment. Nasal washings and aspirates are unacceptable for Xpert Xpress SARS-CoV-2/FLU/RSV testing.  Fact Sheet for Patients: BloggerCourse.com  Fact Sheet for Healthcare Providers: SeriousBroker.it  This test is not yet approved or cleared by the [place] FDA and has been authorized for detection and/or diagnosis of SARS-CoV-2 by FDA under an Emergency Use Authorization (EUA). This EUA will remain in effect (meaning this test can be used) for the duration of the COVID-19 declaration under Section 564(b)(1) of the Act, 21 U.S.C. section 360bbb-3(b)(1), unless the authorization is terminated or revoked.  Performed at [facility], [address] [place], [place] [address]   CULTURE, BLOOD (ROUTINE X 2) w Reflex to ID Panel     Status: None (Preliminary result)   Collection Time: 02/22/21  5:22 AM   Specimen: BLOOD  Result Value Ref Range Status   Specimen Description BLOOD RIGHT [facility]  Final   Special Requests   Final    BOTTLES DRAWN AEROBIC AND ANAEROBIC Blood Culture adequate volume   Culture   Final    NO GROWTH 1 DAY Performed at [facility], [address] [place], [place] [address]    Report Status PENDING  Incomplete  CULTURE, BLOOD (ROUTINE X 2) w Reflex to ID Panel     Status: None (Preliminary result)   Collection Time: 02/22/21  5:29 AM   Specimen: BLOOD  Result Value Ref Range Status   Specimen Description BLOOD RIGHT HAND  Final   Special Requests   Final    BOTTLES DRAWN AEROBIC AND ANAEROBIC Blood Culture adequate volume   Culture   Final    NO GROWTH 1 DAY Performed at [facility], [address], [place], [place] [address]    Report Status PENDING  Incomplete     Labs: BNP (last 3 results) No results for input(s): BNP in the last 8760 hours. Basic Metabolic Panel: Recent Labs  Lab 02/22/21 0043 02/22/21 0522  NA 140 138  K 3.6 3.8  CL 103 100  CO2 27 27  GLUCOSE 109* 176*  BUN 11 13  CREATININE 0.96 1.23  CALCIUM 8.8* 9.2   Liver Function Tests: Recent Labs  Lab 02/22/21 0043  AST  15  ALT 13  ALKPHOS 63  BILITOT 1.1  PROT 8.0  ALBUMIN 3.7   No results for input(s): LIPASE, AMYLASE in the last 168 hours. No results for input(s): AMMONIA in the last 168 hours. CBC: Recent Labs  Lab 02/21/21 2200 02/22/21 0522  WBC 14.7* 15.7*  NEUTROABS 11.5*  --   HGB 14.7 13.6  HCT 42.2 39.5  MCV 84.4 84.0  PLT 235 269   Cardiac Enzymes: No results for input(s): CKTOTAL, CKMB, CKMBINDEX, TROPONINI in the last 168 hours. BNP: Invalid input(s): POCBNP CBG: No results for input(s): GLUCAP in the last 168 hours. D-Dimer No results for input(s): DDIMER in the last 72 hours. Hgb A1c No results for input(s): HGBA1C in the last 72 hours. Lipid Profile No results for input(s): CHOL, HDL, LDLCALC, TRIG, CHOLHDL, LDLDIRECT in the last 72 hours. Thyroid function studies No results for input(s): TSH, T4TOTAL, T3FREE, THYROIDAB in the last 72 hours.  Invalid input(s): FREET3 Anemia work up No results for input(s): VITAMINB12, FOLATE, FERRITIN, TIBC, IRON, RETICCTPCT in the last 72  hours. Urinalysis No results found for: COLORURINE, APPEARANCEUR, LABSPEC, PHURINE, GLUCOSEU, HGBUR, BILIRUBINUR, KETONESUR, PROTEINUR, UROBILINOGEN, NITRITE, LEUKOCYTESUR Sepsis Labs Invalid input(s): PROCALCITONIN,  WBC,  LACTICIDVEN Microbiology Recent Results (from the past 240 hour(s))  Group A Strep by PCR ([facility] Only)     Status: Abnormal   Collection Time: 02/21/21 10:00 PM   Specimen: Throat; Sterile Swab  Result Value Ref Range Status   Group A Strep by PCR DETECTED (A) NOT DETECTED Final    Comment: Performed at [facility], [address], [place], [place] [address]  Resp Panel by RT-PCR (Flu A&B, Covid) Nasopharyngeal Swab     Status: None   Collection Time: 02/22/21  2:14 AM   Specimen: Nasopharyngeal Swab; Nasopharyngeal(NP) swabs in vial transport medium  Result Value Ref Range Status   SARS Coronavirus 2 by RT PCR NEGATIVE NEGATIVE Final    Comment: (NOTE) SARS-CoV-2 target nucleic acids are NOT DETECTED.  The SARS-CoV-2 RNA is generally detectable in upper respiratory specimens during the acute phase of infection. The lowest concentration of SARS-CoV-2 viral copies this assay can detect is 138 copies/mL. A negative result does not preclude SARS-Cov-2 infection and should not be used as the sole basis for treatment or other patient management decisions. A negative result may occur with  improper specimen collection/handling, submission of specimen other than nasopharyngeal swab, presence of viral mutation(s) within the areas targeted by this assay, and inadequate number of viral copies(<138 copies/mL). A negative result must be combined with clinical observations, patient history, and epidemiological information. The expected result is Negative.  Fact Sheet for Patients:  BloggerCourse.com  Fact Sheet for Healthcare Providers:  SeriousBroker.it  This test is no t yet approved or cleared by the [place] FDA and  has been authorized for detection and/or diagnosis of SARS-CoV-2 by FDA under an Emergency Use Authorization (EUA). This EUA will remain  in effect (meaning this test can be used) for the duration of the COVID-19 declaration under Section 564(b)(1) of the Act, 21 U.S.C.section 360bbb-3(b)(1), unless the authorization is terminated  or revoked sooner.       Influenza A by PCR NEGATIVE NEGATIVE Final   Influenza B by PCR NEGATIVE NEGATIVE Final    Comment: (NOTE) The Xpert Xpress SARS-CoV-2/FLU/RSV plus assay is intended as an aid in the diagnosis of influenza from Nasopharyngeal swab specimens and should not be used as a sole basis for treatment. Nasal  washings and aspirates are unacceptable for Xpert Xpress SARS-CoV-2/FLU/RSV testing.  Fact Sheet for Patients: BloggerCourse.comhttps://www.fda.gov/media/152166/download  Fact Sheet for Healthcare Providers: SeriousBroker.ithttps://www.fda.gov/media/152162/download  This test is not yet approved or cleared by the [place] FDA and has been authorized for detection and/or diagnosis of SARS-CoV-2 by FDA under an Emergency Use Authorization (EUA). This EUA will remain in effect (meaning this test can be used) for the duration of the COVID-19 declaration under Section 564(b)(1) of the Act, 21 U.S.C. section 360bbb-3(b)(1), unless the authorization is terminated or revoked.  Performed at [facility], [address] [place], [place] [address]   CULTURE, BLOOD (ROUTINE X 2) w Reflex to ID Panel     Status: None (Preliminary result)   Collection Time: 02/22/21  5:22 AM   Specimen: BLOOD  Result Value Ref Range Status   Specimen Description BLOOD RIGHT [facility]  Final   Special Requests   Final    BOTTLES DRAWN AEROBIC AND ANAEROBIC Blood Culture adequate volume   Culture   Final    NO GROWTH 1 DAY Performed at [facility], [address] [place], [place] [address]    Report Status PENDING  Incomplete  CULTURE, BLOOD (ROUTINE  X 2) w Reflex to ID Panel     Status: None (Preliminary result)   Collection Time: 02/22/21  5:29 AM   Specimen: BLOOD  Result Value Ref Range Status   Specimen Description BLOOD RIGHT HAND  Final   Special Requests   Final    BOTTLES DRAWN AEROBIC AND ANAEROBIC Blood Culture adequate volume   Culture   Final    NO GROWTH 1 DAY Performed at [facility], [address] [place], [place] [address]    Report Status PENDING  Incomplete    Time coordinating discharge: Over 30 minutes  SIGNED:  Arnetha CourserSumayya Roza Creamer, MD  [facility] 02/23/2021, 10:34 AM  If 7PM-7AM, please contact night-coverage [email or web address]  This record has been created using Conservation officer, historic buildingsDragon voice recognition software. Errors have been sought and corrected,but may not always be located. Such creation errors do not reflect on the standard of care.

## 2021-02-23 NOTE — Care Management (Signed)
Patient to discharge today Ride at bedside D[facility]ed [facility] with [facility] for [facility] and provided application Provided patient with good rx coupons for discharge medications. Patient confirms he will be able to obtain at discharge

## 2021-02-23 NOTE — Progress Notes (Signed)
Pt d/c'd home in care of friend via PMV with all belongs and discharge instructions (to include goodrx coupons and [facility] application). Instructions reviewed, pportunity to ask questions provided, questions answered.

## 2021-02-25 ENCOUNTER — Ambulatory Visit: Payer: Medicaid Other | Admitting: Gerontology

## 2021-02-25 ENCOUNTER — Encounter: Payer: Self-pay | Admitting: Gerontology

## 2021-02-25 ENCOUNTER — Other Ambulatory Visit: Payer: Self-pay | Admitting: Gerontology

## 2021-02-25 ENCOUNTER — Other Ambulatory Visit: Payer: Self-pay

## 2021-02-25 VITALS — BP 151/87 | HR 69 | Ht 69.0 in | Wt 193.6 lb

## 2021-02-25 DIAGNOSIS — M25561 Pain in right knee: Secondary | ICD-10-CM | POA: Insufficient documentation

## 2021-02-25 DIAGNOSIS — G8929 Other chronic pain: Secondary | ICD-10-CM

## 2021-02-25 DIAGNOSIS — Z7689 Persons encountering health services in other specified circumstances: Secondary | ICD-10-CM | POA: Insufficient documentation

## 2021-02-25 DIAGNOSIS — I10 Essential (primary) hypertension: Secondary | ICD-10-CM

## 2021-02-25 DIAGNOSIS — J36 Peritonsillar abscess: Secondary | ICD-10-CM

## 2021-02-25 MED ORDER — AMLODIPINE BESYLATE 5 MG PO TABS: 5.0000 mg | ORAL_TABLET | Freq: Every day | ORAL | 0 refills | Status: DC

## 2021-02-25 MED ORDER — LOSARTAN POTASSIUM 50 MG PO TABS: 75.0000 mg | ORAL_TABLET | Freq: Every day | ORAL | 0 refills | Status: DC

## 2021-02-25 NOTE — Progress Notes (Signed)
New Patient Office Visit  Subjective:  Patient ID: Micheal Baxter, male    DOB: 08/12/1971  Age: 50 y.o. MRN: [id number]  CC: Establish care; hospital follow up   HPI Micheal Baxter presents for follow up after hospital admission for peritonsillar abscess and to establish care. Aside from recurrent strep, he denies any medical or surgical history. He was incidentally found to have elevated blood pressure readings. He was started on losartan and discharged home. He was also discharged on augmentin and prednisone. He reports compliance with his medications. He has not been checking his blood pressure at home. He denies chest pain, swelling to lower extremities, or headaches. He does occasionally have hot flashes. He denies lightheadedness or dizziness. The pain in his throat has improved and he continues with his abx regimen. He is scheduled to see ENT next week for follow up and further evaluation. He desires to have a tonsillectomy, due to the reoccurrence of this. He denies fever/chills, difficulties swallowing, or SOB.  He does report pain in bilateral knees. This has been ongoing for years. He has had injury to the knee in the past. He has never had surgical intervention for this and does not take anything OTC. He denies any recent injury to either knee. It does not negatively impact his gait, at this time. He denies popping or clicking. He rates the pain as mild or moderate. Overall, he feels well and offers no further complaints.   Past Medical History:  Diagnosis Date  . Hypertension     History reviewed. No pertinent surgical history.   Family History  Problem Relation Age of Onset  . Healthy Mother   . Healthy Father     Social History   Socioeconomic History  . Marital status: Single    Spouse name: Not on file  . Number of children: Not on file  . Years of education: Not on file  . Highest education level: Not on file  Occupational History  . Not on file  Tobacco  Use  . Smoking status: Never Smoker  . Smokeless tobacco: Never Used  Substance and Sexual Activity  . Alcohol use: Not Currently  . Drug use: Not Currently    Types: Marijuana  . Sexual activity: Yes    Birth control/protection: None  Other Topics Concern  . Not on file  Social History Narrative  . Not on file   Social Determinants of Health   Financial Resource Strain: Not on file  Food Insecurity: Not on file  Transportation Needs: Not on file  Physical Activity: Not on file  Stress: Not on file  Social Connections: Not on file  Intimate Partner Violence: Not on file    ROS Review of Systems  Constitutional: Negative.  Negative for chills and fever.  HENT: Positive for sore throat (peritonsillar abscess present; sore throat has improved). Negative for trouble swallowing.   Respiratory: Negative.   Cardiovascular: Negative.   Gastrointestinal: Negative.   Genitourinary: Negative.   Musculoskeletal: Negative.   Skin: Negative.   Neurological: Negative.   Psychiatric/Behavioral: Negative.     Objective:   Today's Vitals:  Today's Vitals   02/25/21 1056  BP: (!) 151/87  Pulse: 69  SpO2: 93%  Weight: 193 lb 9.6 oz (87.8 kg)  Height: 5' 9" (1.753 m)   Body mass index is 28.59 kg/m.   Physical Exam Constitutional:      Appearance: He is well-developed.  HENT:     Head: Normocephalic.  Right Ear: Tympanic membrane and ear canal normal.     Left Ear: Tympanic membrane and ear canal normal.     Mouth/Throat:     Mouth: Mucous membranes are moist.     Pharynx: Oropharynx is clear.     Tonsils: Tonsillar abscess present. No tonsillar exudate. 2+ on the right.  Eyes:     Conjunctiva/sclera: Conjunctivae normal.     Pupils: Pupils are equal, round, and reactive to light.  Neck:     Thyroid: No thyromegaly.  Cardiovascular:     Rate and Rhythm: Normal rate and regular rhythm.     Heart sounds: Normal heart sounds.  Pulmonary:     Effort: Pulmonary  effort is normal.     Breath sounds: Normal breath sounds.  Abdominal:     General: Bowel sounds are normal.     Palpations: Abdomen is soft.  Musculoskeletal:     Cervical back: Normal range of motion and neck supple.  Lymphadenopathy:     Cervical: Cervical adenopathy present.  Skin:    General: Skin is warm and dry.     Capillary Refill: Capillary refill takes less than 2 seconds.  Neurological:     General: No focal deficit present.     Mental Status: He is alert and oriented to person, place, and time.  Psychiatric:        Mood and Affect: Mood normal.        Behavior: Behavior normal.     Assessment & Plan:   1. Essential hypertension Pt's BP remains elevated today. Increased losartan and added amlodipine to regimen. Discussed potential side effects of medications, including hypotension and lower extremity edema. Notify if any of these develop. Monitor BP at home. Goal <140/90. DASH diet recommended.  - Comp Met (CMET); Future - Lipid Profile; Future - amLODipine (NORVASC) 5 MG tablet; Take 1 tablet (5 mg total) by mouth daily.  Dispense: 30 tablet; Refill: 0 - losartan (COZAAR) 50 MG tablet; Take 1.5 tablets (75 mg total) by mouth daily.  Dispense: 60 tablet; Refill: 0 - Ambulatory referral to Ophthalmology  2. Encounter to establish care Encouraged healthy dietary changes and 150 min of exercise/week. Increase water intake. Colonoscopy for colorectal screening ordered. [facility] Financial Assistance application provided to patient. - HgB A1c; Future - Ambulatory referral to Gastroenterology  3. Peritonsillar abscess Non-toxic, well-appearing at visit. No reports of fevers/chills and compliant with abx regimen. Continue abx therapy until complete. Follow up with ENT for further evaluation and treatment next week. Will repeat CBC to ensure leukocytosis has resolved. Go to the ED if worsening of pain, difficulties swallowing, SOB or difficulties breathing, or fever/chills  develop. - CBC w/Diff; Future  4. Bilateral knee pain, chronic No acute etiology or recent injury. Gait unaffected. Follow up with [facility] Ortho Dr. Vickki Hearing for further evaluation and treatment.   Outpatient Encounter Medications as of 02/25/2021  Medication Sig  . amoxicillin-clavulanate (AUGMENTIN) 875-125 MG tablet Take 1 tablet by mouth 2 (two) times daily for 5 days.  Marland Kitchen losartan (COZAAR) 50 MG tablet Take 1 tablet (50 mg total) by mouth daily.  Marland Kitchen menthol-cetylpyridinium (CEPACOL) 3 MG lozenge Take 1 lozenge (3 mg total) by mouth as needed for sore throat.  . predniSONE (STERAPRED UNI-PAK 21 TAB) 10 MG (21) TBPK tablet Take 5 tablets on day 1, decrease by 1 tablet every day till you a at 1 tablet and then stop   No facility-administered encounter medications on file as of 02/25/2021.  Follow-up: Return in one month (03/25/21), or if symptoms worsen or do not improve.    Clayton Bibles, RN, BSN, FNP-S

## 2021-02-25 NOTE — Patient Instructions (Signed)

## 2021-02-27 LAB — CULTURE, BLOOD (ROUTINE X 2)
Culture: NO GROWTH
Culture: NO GROWTH
Special Requests: ADEQUATE
Special Requests: ADEQUATE

## 2021-03-11 ENCOUNTER — Other Ambulatory Visit: Payer: Self-pay

## 2021-03-11 DIAGNOSIS — Z1211 Encounter for screening for malignant neoplasm of colon: Secondary | ICD-10-CM

## 2021-03-11 MED ORDER — NA SULFATE-K SULFATE-MG SULF 17.5-3.13-1.6 GM/177ML PO SOLN: 1.0000 | Freq: Once | ORAL | 0 refills | Status: AC

## 2021-03-11 MED ORDER — NA SULFATE-K SULFATE-MG SULF 17.5-3.13-1.6 GM/177ML PO SOLN: 1.0000 | Freq: Once | ORAL | 0 refills | Status: DC

## 2021-03-17 ENCOUNTER — Other Ambulatory Visit: Payer: Medicaid Other

## 2021-03-17 ENCOUNTER — Ambulatory Visit: Payer: Self-pay | Admitting: Gerontology

## 2021-03-22 ENCOUNTER — Telehealth: Payer: Self-pay | Admitting: Gastroenterology

## 2021-03-22 NOTE — Telephone Encounter (Signed)
Gastroenterology Pre-Procedure Review  Request Date: 03/22/2021 Requesting Physician: Dr. Servando Snare    PATIENT REVIEW QUESTIONS: The patient responded to the following health history questions as indicated:    1. Are you having any GI issues? no 2. Do you have a personal history of Polyps? no 3. Do you have a family history of Colon Cancer or Polyps? no 4. Diabetes Mellitus? no 5. Joint replacements in the past 12 months?no 6. Major health problems in the past 3 months?no 7. Any artificial heart valves, MVP, or defibrillator?no    MEDICATIONS & ALLERGIES:    Patient reports the following regarding taking any anticoagulation/antiplatelet therapy:   Plavix, Coumadin, Eliquis, Xarelto, Lovenox, Pradaxa, Brilinta, or Effient? no Aspirin? no  Pulmonary: No BMI: 28.4  Patient confirms/reports the following medications:  Current Outpatient Medications  Medication Sig Dispense Refill  . amLODipine (NORVASC) 5 MG tablet TAKE ONE TABLET BY MOUTH EVERY DAY 30 tablet 0  . losartan (COZAAR) 50 MG tablet Take 1 tablet (50 mg total) by mouth daily. 30 tablet 1  . losartan (COZAAR) 50 MG tablet TAKE 1 AND 1/2 TABLETS (75 MG TOTAL) BY MOUTH EVERY DAY 45 tablet 0  . menthol-cetylpyridinium (CEPACOL) 3 MG lozenge Take 1 lozenge (3 mg total) by mouth as needed for sore throat. 100 lozenge 12  . predniSONE (STERAPRED UNI-PAK 21 TAB) 10 MG (21) TBPK tablet Take 5 tablets on day 1, decrease by 1 tablet every day till you a at 1 tablet and then stop 21 tablet 0   No current facility-administered medications for this visit.    Patient confirms/reports the following allergies:  No Known Allergies  No orders of the defined types were placed in this encounter.   AUTHORIZATION INFORMATION Primary Insurance: 1D#: Group #:  Secondary Insurance: 1D#: Group #:  SCHEDULE INFORMATION: Date:  Time: Location:

## 2021-03-25 ENCOUNTER — Ambulatory Visit: Payer: Medicaid Other | Admitting: Gerontology

## 2021-03-29 ENCOUNTER — Encounter: Payer: Self-pay | Admitting: Gastroenterology

## 2021-03-29 ENCOUNTER — Other Ambulatory Visit: Payer: Self-pay

## 2021-03-29 ENCOUNTER — Encounter: Payer: Self-pay | Admitting: Anesthesiology

## 2021-04-02 NOTE — Anesthesia Preprocedure Evaluation (Deleted)
Anesthesia Evaluation    Airway        Dental   Pulmonary former smoker (quit 12/2020),  COVID+ 12/04/20          Cardiovascular hypertension,      Neuro/Psych    GI/Hepatic   Endo/Other    Renal/GU      Musculoskeletal   Abdominal   Peds  Hematology   Anesthesia Other Findings   Reproductive/Obstetrics                             Anesthesia Physical Anesthesia Plan  ASA: II  Anesthesia Plan: General   Post-op Pain Management:    Induction: Intravenous  PONV Risk Score and Plan: 1 and Propofol infusion, TIVA and Treatment may vary due to age or medical condition  Airway Management Planned: Natural Airway  Additional Equipment:   Intra-op Plan:   Post-operative Plan:   Informed Consent:   Plan Discussed with:   Anesthesia Plan Comments:         Anesthesia Quick Evaluation

## 2021-04-02 NOTE — Discharge Instructions (Signed)

## 2021-04-06 ENCOUNTER — Other Ambulatory Visit: Payer: Self-pay

## 2021-04-06 ENCOUNTER — Encounter
Admission: RE | Admit: 2021-04-06 | Discharge: 2021-04-06 | Disposition: A | Discharge status: Home / Self Care | Payer: Medicaid Other | Source: Ambulatory Visit | Attending: Otolaryngology | Admitting: Otolaryngology

## 2021-04-06 DIAGNOSIS — Z01810 Encounter for preprocedural cardiovascular examination: Secondary | ICD-10-CM

## 2021-04-06 DIAGNOSIS — Z01818 Encounter for other preprocedural examination: Secondary | ICD-10-CM | POA: Insufficient documentation

## 2021-04-06 NOTE — Progress Notes (Signed)
  Perioperative Services Pre-Admission/Anesthesia Testing    Date: 04/06/21  Name: Micheal Baxter MRN:   [id number]  Re: Pre-operative blood pressure control   Case: [id number] Date/Time: 04/14/21 1144   Procedure: TONSILLECTOMY (Bilateral )   Anesthesia type: General   Pre-op diagnosis: chronic tonsillitis   Location: [facility] OR ROOM 09 / [facility]   Surgeons: Geanie Logan, MD    Patient is scheduled for the above procedure on 04/14/2021 with Dr. Geanie Logan.  As part of his preoperative work-up, patient came into the PAT clinic today for EKG.  EKG performed showed normal sinus rhythm with sinus arrhythmia at a rate of 84 bpm; there were no ST or T wave changes.  During his PAT interview, it was noted the patient has a diagnosis of hypertension.  Patient seemed uncertain as to what exactly he was taking.  In review of his EMR, patient has been prescribed amlodipine 5 mg and losartan 50 mg daily.  Patient advised PAT RN that someone had taken him off of the amlodipine and that his losartan dose has been increased to 75 mg. He was unsure of whom the provider was that made these changes. While in the PAT clinic this afternoon, his VS were assessed as follows:  Today's Vitals   04/06/21 1638  BP: (!) 150/101  Pulse: 84  Resp: 18  SpO2: 98%   Review of his EMR indicates that patient was last seen by his PCP Luiz Blare, NP-C) on 02/25/2021; notes reviewed. Blood pressures elevated to the 150/80s at that time. Labs were ordered, however it does not appear as if patient had them done. Of note, renal function was last checked on 02/22/2021 and noted to be normal during a visit to the hospital (BUN 11 and creatinine 0.96 mg/dL). Amlodipine 5 mg and losartan 75 mg prescriptions were refilled and patient was scheduled to RTC in 1 month. To date, patient has not returned to PCP for follow up visit.   Communication sent to PCP on the afternoon of 04/06/2021 to make her aware of  blood pressure readings. I expressed concerns with medication compliance vs confusion regarding what he has been prescribed. Request made for them to follow up with patient in efforts to achieve better pre-operative control of patient's blood pressure to avoid delaying his planned surgery. Better control of his blood pressure will reduce risk of perioperative complications associated with the procedure and anesthesia. Awaiting response from PCP at this time. Will update documentation with recommendations as additional clinical information becomes available from PCP.   Quentin Mulling, MSN, APRN, FNP-C, CEN [facility]  Peri-operative Services Nurse Practitioner Phone: [phone] 04/06/21 4:57 PM

## 2021-04-06 NOTE — Patient Instructions (Addendum)
Your procedure is scheduled on: 04/14/21 Report to DAY SURGERY DEPARTMENT LOCATED ON 2ND FLOOR MEDICAL MALL ENTRANCE. To find out your arrival time please call [phone] between 1PM - 3PM on 04/13/21.  Remember: Instructions that are not followed completely may result in serious medical risk, up to and including death, or upon the discretion of your surgeon and anesthesiologist your surgery may need to be rescheduled.     _X__ 1. Do not eat food or drink any liquids after midnight the night before your procedure.                   __X__2.  On the morning of surgery brush your teeth with toothpaste and water, you                 may rinse your mouth with mouthwash if you wish.  Do not swallow any              toothpaste of mouthwash.     _X__ 3.  No Alcohol for 24 hours before or after surgery.   _X__ 4.  Do Not Smoke or use e-cigarettes For 24 Hours Prior to Your Surgery.                 Do not use any chewable tobacco products for at least 6 hours prior to                 surgery.  ____  5.  Bring all medications with you on the day of surgery if instructed.   __X__  6.  Notify your doctor if there is any change in your medical condition      (cold, fever, infections).     Do not wear jewelry, make-up, hairpins, clips or nail polish. Do not wear lotions, powders, or perfumes.  Do not shave 48 hours prior to surgery. Men may shave face and neck. Do not bring valuables to the hospital.    [facility] is not responsible for any belongings or valuables.  Contacts, dentures/partials or body piercings may not be worn into surgery. Bring a case for your contacts, glasses or hearing aids, a denture cup will be supplied. Leave your suitcase in the car. After surgery it may be brought to your room. For patients admitted to the hospital, discharge time is determined by your treatment team.   Patients discharged the day of surgery will not be allowed to drive home.   Please read over the  following fact sheets that you were given:     __X__ Take these medicines the morning of surgery with A SIP OF WATER:    1. Bring your medicine with you the morning of surgery  2.   3.   4.  5.  6.  ____ Fleet Enema (as directed)   __X__ Use CHG Soap/SAGE wipes as directed  ____ Use inhalers on the day of surgery  ____ Stop metformin/Janumet/Farxiga 2 days prior to surgery    ____ Take 1/2 of usual insulin dose the night before surgery. No insulin the morning          of surgery.   ____ Stop Blood Thinners Coumadin/Plavix/Xarelto/Pleta/Pradaxa/Eliquis/Effient/Aspirin  on   Or contact your Surgeon, Cardiologist or Medical Doctor regarding  ability to stop your blood thinners  __X__ Stop Anti-inflammatories 7 days before surgery such as Advil, Ibuprofen, Motrin,  BC or Goodies Powder, Naprosyn, Naproxen, Aleve, Aspirin    __X__ Stop all herbal supplements, fish oil or vitamin  E until after surgery.    ____ Bring C-Pap to the hospital.   Here are 2 locations in [place] that may be able to assist you with finding a Primary Physician to manage your health and especially keep an eye on your blood pressure. Call to schedule an appointment ASAP before your blood pressure medications run out.   [facility]                                         [address] [place], [place] [address]   Main.....[phone] Pharmacy..[phone] Dental....[phone] Fax......[phone] After [phone]  Medical Office Hours Mon                 8am - 8pm Tue - Wed        8am - 5pm Thu                 8am - 8pm Fri                   8am - 1pm  Dental Office Hours Mon                 8am - 6pm Tue - Wed        8am - 6pm Thu                 8am - 6pm Fri                   8am - 5pm  Pharmacy Hours Mon                8:30am - 8pm Tue - Wed       8:30am - 5pm Thu                 8:30am - 8pm Fri                   8:30am - 1pm   [facility] Real  [facility] [address] [place], [place] [address] (Get Directions)  Main.....[phone] Pharmacy..[phone] Fax......[phone] After Hours.[phone]  Medical Office Hours Mon                 8am - 5pm Tue - Wed        8am - 8pm Thu                  8am - 5pm Fri                    8am - 1pm Sat                   8am - 1pm  Pharmacy Hours Mon                8:30am - 5pm Tue - Wed       8:30am - 8pm Thu                 8:30am - 5pm Fri                   8:30am - 1pm

## 2021-04-09 ENCOUNTER — Other Ambulatory Visit: Payer: Self-pay

## 2021-04-09 MED ORDER — PEG 3350-KCL-NA BICARB-NACL 420 G PO SOLR: ORAL | 0 refills | Status: DC

## 2021-04-12 ENCOUNTER — Ambulatory Visit: Admission: RE | Admit: 2021-04-12 | Payer: Medicaid Other | Source: Ambulatory Visit | Admitting: Gastroenterology

## 2021-04-12 ENCOUNTER — Other Ambulatory Visit: Payer: Medicaid Other

## 2021-04-12 SURGERY — COLONOSCOPY WITH PROPOFOL
Anesthesia: Choice

## 2021-04-14 ENCOUNTER — Ambulatory Visit
Admission: RE | Admit: 2021-04-14 | Discharge: 2021-04-14 | Disposition: A | Discharge status: Home / Self Care | Payer: Self-pay | Attending: Otolaryngology | Admitting: Otolaryngology

## 2021-04-14 ENCOUNTER — Ambulatory Visit: Payer: Self-pay | Admitting: Urgent Care

## 2021-04-14 ENCOUNTER — Ambulatory Visit: Payer: Self-pay

## 2021-04-14 ENCOUNTER — Encounter
Admission: RE | Disposition: A | Discharge status: Home / Self Care | Payer: Self-pay | Source: Home / Self Care | Attending: Otolaryngology

## 2021-04-14 ENCOUNTER — Encounter: Payer: Self-pay | Admitting: Otolaryngology

## 2021-04-14 DIAGNOSIS — Z833 Family history of diabetes mellitus: Secondary | ICD-10-CM | POA: Insufficient documentation

## 2021-04-14 DIAGNOSIS — Z8616 Personal history of COVID-19: Secondary | ICD-10-CM | POA: Insufficient documentation

## 2021-04-14 DIAGNOSIS — F17200 Nicotine dependence, unspecified, uncomplicated: Secondary | ICD-10-CM | POA: Insufficient documentation

## 2021-04-14 DIAGNOSIS — Z8249 Family history of ischemic heart disease and other diseases of the circulatory system: Secondary | ICD-10-CM | POA: Insufficient documentation

## 2021-04-14 DIAGNOSIS — I10 Essential (primary) hypertension: Secondary | ICD-10-CM | POA: Insufficient documentation

## 2021-04-14 DIAGNOSIS — Z79899 Other long term (current) drug therapy: Secondary | ICD-10-CM | POA: Insufficient documentation

## 2021-04-14 DIAGNOSIS — Z801 Family history of malignant neoplasm of trachea, bronchus and lung: Secondary | ICD-10-CM | POA: Insufficient documentation

## 2021-04-14 DIAGNOSIS — J3501 Chronic tonsillitis: Secondary | ICD-10-CM | POA: Insufficient documentation

## 2021-04-14 HISTORY — PX: TONSILLECTOMY: SHX5217

## 2021-04-14 SURGERY — TONSILLECTOMY
Anesthesia: General | Laterality: Bilateral

## 2021-04-14 MED ORDER — FAMOTIDINE 20 MG PO TABS
ORAL_TABLET | ORAL | Status: AC
  Filled 2021-04-14: qty 1

## 2021-04-14 MED ORDER — ORAL CARE MOUTH RINSE: 15.0000 mL | Freq: Once | OROMUCOSAL | Status: AC

## 2021-04-14 MED ORDER — ROCURONIUM BROMIDE 100 MG/10ML IV SOLN
INTRAVENOUS | Status: DC | PRN
  Administered 2021-04-14: 10 mg via INTRAVENOUS
  Administered 2021-04-14: 30 mg via INTRAVENOUS

## 2021-04-14 MED ORDER — DEXAMETHASONE SODIUM PHOSPHATE 10 MG/ML IJ SOLN
INTRAMUSCULAR | Status: DC | PRN
  Administered 2021-04-14: 10 mg via INTRAVENOUS

## 2021-04-14 MED ORDER — FENTANYL CITRATE (PF) 100 MCG/2ML IJ SOLN
INTRAMUSCULAR | Status: AC
  Filled 2021-04-14: qty 2

## 2021-04-14 MED ORDER — FAMOTIDINE 20 MG PO TABS
20.0000 mg | ORAL_TABLET | Freq: Once | ORAL | Status: AC
  Administered 2021-04-14: 20 mg via ORAL

## 2021-04-14 MED ORDER — LABETALOL HCL 5 MG/ML IV SOLN
INTRAVENOUS | Status: AC
  Filled 2021-04-14: qty 4

## 2021-04-14 MED ORDER — BUPIVACAINE-EPINEPHRINE 0.25% -1:200000 IJ SOLN
INTRAMUSCULAR | Status: DC | PRN
  Administered 2021-04-14: 4 mL

## 2021-04-14 MED ORDER — LABETALOL HCL 5 MG/ML IV SOLN
INTRAVENOUS | Status: DC | PRN
  Administered 2021-04-14: 5 mg via INTRAVENOUS

## 2021-04-14 MED ORDER — SUGAMMADEX SODIUM 500 MG/5ML IV SOLN
INTRAVENOUS | Status: DC | PRN
  Administered 2021-04-14: 200 mg via INTRAVENOUS

## 2021-04-14 MED ORDER — LIDOCAINE HCL (PF) 2 % IJ SOLN
INTRAMUSCULAR | Status: AC
  Filled 2021-04-14: qty 5

## 2021-04-14 MED ORDER — FENTANYL CITRATE (PF) 100 MCG/2ML IJ SOLN: 25.0000 ug | INTRAMUSCULAR | Status: DC | PRN

## 2021-04-14 MED ORDER — LACTATED RINGERS IV SOLN: INTRAVENOUS | Status: DC

## 2021-04-14 MED ORDER — DEXMEDETOMIDINE (PRECEDEX) IN NS 20 MCG/5ML (4 MCG/ML) IV SYRINGE
PREFILLED_SYRINGE | INTRAVENOUS | Status: AC
  Filled 2021-04-14: qty 5

## 2021-04-14 MED ORDER — DEXAMETHASONE SODIUM PHOSPHATE 10 MG/ML IJ SOLN
INTRAMUSCULAR | Status: AC
  Filled 2021-04-14: qty 1

## 2021-04-14 MED ORDER — MIDAZOLAM HCL 2 MG/2ML IJ SOLN
INTRAMUSCULAR | Status: AC
  Filled 2021-04-14: qty 2

## 2021-04-14 MED ORDER — SUCCINYLCHOLINE CHLORIDE 200 MG/10ML IV SOSY
PREFILLED_SYRINGE | INTRAVENOUS | Status: AC
  Filled 2021-04-14: qty 10

## 2021-04-14 MED ORDER — BUPIVACAINE-EPINEPHRINE (PF) 0.25% -1:200000 IJ SOLN
INTRAMUSCULAR | Status: AC
  Filled 2021-04-14: qty 30

## 2021-04-14 MED ORDER — HYDROCODONE-ACETAMINOPHEN 7.5-325 MG/15ML PO SOLN: ORAL | 0 refills | Status: DC

## 2021-04-14 MED ORDER — PROPOFOL 10 MG/ML IV BOLUS
INTRAVENOUS | Status: AC
  Filled 2021-04-14: qty 40

## 2021-04-14 MED ORDER — ONDANSETRON HCL 4 MG/2ML IJ SOLN
INTRAMUSCULAR | Status: AC
  Filled 2021-04-14: qty 2

## 2021-04-14 MED ORDER — ONDANSETRON HCL 4 MG/2ML IJ SOLN
INTRAMUSCULAR | Status: DC | PRN
  Administered 2021-04-14: 4 mg via INTRAVENOUS

## 2021-04-14 MED ORDER — CHLORHEXIDINE GLUCONATE 0.12 % MT SOLN
OROMUCOSAL | Status: AC
  Filled 2021-04-14: qty 15

## 2021-04-14 MED ORDER — DEXMEDETOMIDINE (PRECEDEX) IN NS 20 MCG/5ML (4 MCG/ML) IV SYRINGE
PREFILLED_SYRINGE | INTRAVENOUS | Status: DC | PRN
  Administered 2021-04-14: 8 ug via INTRAVENOUS
  Administered 2021-04-14 (×2): 12 ug via INTRAVENOUS

## 2021-04-14 MED ORDER — PREDNISOLONE SODIUM PHOSPHATE 15 MG/5ML PO SOLN: ORAL | 0 refills | Status: DC

## 2021-04-14 MED ORDER — MIDAZOLAM HCL 2 MG/2ML IJ SOLN
INTRAMUSCULAR | Status: DC | PRN
  Administered 2021-04-14: 2 mg via INTRAVENOUS

## 2021-04-14 MED ORDER — ROCURONIUM BROMIDE 10 MG/ML (PF) SYRINGE
PREFILLED_SYRINGE | INTRAVENOUS | Status: AC
  Filled 2021-04-14: qty 10

## 2021-04-14 MED ORDER — FENTANYL CITRATE (PF) 100 MCG/2ML IJ SOLN
INTRAMUSCULAR | Status: DC | PRN
  Administered 2021-04-14: 25 ug via INTRAVENOUS
  Administered 2021-04-14 (×2): 50 ug via INTRAVENOUS

## 2021-04-14 MED ORDER — ONDANSETRON HCL 4 MG/2ML IJ SOLN: 4.0000 mg | Freq: Once | INTRAMUSCULAR | Status: DC | PRN

## 2021-04-14 MED ORDER — LIDOCAINE HCL (CARDIAC) PF 100 MG/5ML IV SOSY
PREFILLED_SYRINGE | INTRAVENOUS | Status: DC | PRN
  Administered 2021-04-14: 40 mg via INTRAVENOUS

## 2021-04-14 MED ORDER — CHLORHEXIDINE GLUCONATE 0.12 % MT SOLN
15.0000 mL | Freq: Once | OROMUCOSAL | Status: AC
  Administered 2021-04-14: 15 mL via OROMUCOSAL

## 2021-04-14 MED ORDER — OXYMETAZOLINE HCL 0.05 % NA SOLN
NASAL | Status: AC
  Filled 2021-04-14: qty 30

## 2021-04-14 MED ORDER — SUCCINYLCHOLINE CHLORIDE 20 MG/ML IJ SOLN
INTRAMUSCULAR | Status: DC | PRN
  Administered 2021-04-14: 140 mg via INTRAVENOUS

## 2021-04-14 MED ORDER — PROPOFOL 10 MG/ML IV BOLUS
INTRAVENOUS | Status: DC | PRN
  Administered 2021-04-14: 200 mg via INTRAVENOUS

## 2021-04-14 SURGICAL SUPPLY — 17 items
CANISTER SUCT 1200ML W/VALVE (MISCELLANEOUS) ×2 IMPLANT
CATH ROBINSON RED A/P 10FR (CATHETERS) ×2 IMPLANT
COAG SUCT 10F 3.5MM HAND CTRL (MISCELLANEOUS) ×2 IMPLANT
COVER WAND RF STERILE (DRAPES) ×2 IMPLANT
DEFOGGER ANTIFOG KIT (MISCELLANEOUS) ×2 IMPLANT
ELECT REM PT RETURN 9FT ADLT (ELECTROSURGICAL) ×2
ELECTRODE REM PT RTRN 9FT ADLT (ELECTROSURGICAL) ×1 IMPLANT
GLOVE SURG ENC MOIS LTX SZ7.5 (GLOVE) ×2 IMPLANT
GOWN STRL REUS W/ TWL LRG LVL3 (GOWN DISPOSABLE) ×2 IMPLANT
GOWN STRL REUS W/TWL LRG LVL3 (GOWN DISPOSABLE) ×4
KIT TURNOVER KIT A (KITS) ×2 IMPLANT
LABEL OR SOLS (LABEL) ×2 IMPLANT
MANIFOLD NEPTUNE II (INSTRUMENTS) ×2 IMPLANT
NS IRRIG 500ML POUR BTL (IV SOLUTION) ×2 IMPLANT
PACK HEAD/NECK (MISCELLANEOUS) ×2 IMPLANT
PENCIL ELECTRO HAND CTR (MISCELLANEOUS) ×2 IMPLANT
SPONGE TONSIL TAPE 1 RFD (DISPOSABLE) ×2 IMPLANT

## 2021-04-14 NOTE — H&P (Signed)
Micheal Baxter, Micheal Baxter [id number] 1971-03-06  Date of Admission: @TODAY @ Admitting Physician:  Chief Complaint: chronic tonsillitis  HPI: This 50 y.o. year old male with recurrent tonsillitis and prior peritonsillar abscess.  Medications:  Medications Prior to Admission  Medication Sig Dispense Refill  . losartan (COZAAR) 50 MG tablet TAKE 1 AND 1/2 TABLETS (75 MG TOTAL) BY MOUTH EVERY DAY (Patient taking differently: Take 75 mg by mouth daily.) 45 tablet 0  . polyethylene glycol-electrolytes (GAVILYTE-N WITH FLAVOR PACK) 420 g solution Drink one 8 oz glass every 20 mins until entire container is finished starting at 5:00pm on Sunday 4000 mL 0  . amLODipine (NORVASC) 5 MG tablet TAKE ONE TABLET BY MOUTH EVERY DAY (Patient not taking: Reported on 04/14/2021) 30 tablet 0    Allergies: No Known Allergies  PMH:  Past Medical History:  Diagnosis Date  . COVID-19 12/04/2020   Asymptomatic  . Hypertension     Fam Hx:  Family History  Problem Relation Age of Onset  . Healthy Mother   . Healthy Father 74  . Hypertension Maternal Grandfather   . Diabetes Maternal Grandfather   . Lung cancer Maternal Grandfather     Soc Hx:  Social History   Socioeconomic History  . Marital status: Single    Spouse name: Not on file  . Number of children: 7  . Years of education: Not on file  . Highest education level: Not on file  Occupational History  . Not on file  Tobacco Use  . Smoking status: Current Some Day Smoker    Last attempt to quit: 12/29/2020    Years since quitting: 0.2  . Smokeless tobacco: Never Used  Vaping Use  . Vaping Use: Never used  Substance and Sexual Activity  . Alcohol use: Not Currently  . Drug use: Not Currently    Types: Marijuana  . Sexual activity: Yes    Birth control/protection: Condom  Other Topics Concern  . Not on file  Social History Narrative  . Not on file   Social Determinants of Health   Financial Resource Strain: Not on file   Food Insecurity: Not on file  Transportation Needs: Not on file  Physical Activity: Not on file  Stress: Not on file  Social Connections: Not on file  Intimate Partner Violence: Not on file    PSH: History reviewed. No pertinent surgical history..   ROS: Negative for fever, cough..  PHYSICAL EXAM  Vitals: Blood pressure (!) 121/97, pulse 81, temperature 97.9 F (36.6 C), temperature source Temporal, resp. rate 18, height 5\' 10"  (1.778 m), weight 85.7 kg, SpO2 97 %.. General: Well-developed, Well-nourished in no acute distress Mood: Mood and affect well adjusted, pleasant and cooperative. Orientation: Grossly alert and oriented. Vocal Quality: No hoarseness. Communicates verbally. head and Face: NCAT. No facial asymmetry. No visible skin lesions. No significant facial scars. No tenderness with sinus percussion. Facial strength normal and symmetric. Respiratory: Normal respiratory effort without labored breathing. Lung CTA B Cardiovascular: Heart shows regular rate and rhythm Neurologic: Cranial Nerves II through XII are grossly intact. Eyes: Gaze and Ocular Motility are grossly normal. PERRLA. No visible nystagmus.  MEDICAL DECISION MAKING: Data Review: No results found for this or any previous visit (from the past 48 hour(s)).02/26/2021 No results found..   ASSESSMENT: Chronic tonsillitis  PLAN: Tonsillectomy   04/14/2021 11:22 AM

## 2021-04-14 NOTE — Progress Notes (Signed)
Micheal Baxter has been in phase II since 1359 and arrived from PACU groggy and slow to respond to commands. His last dose of pain medication (Fentanyl) was at 1201. He has been unable to open his eyes, stand or ambulate. Paged Dr Randa Ngo to assess him. His vitals are stable 150/98 b/p, 75 hr, 98% O2 sats on room air. Per Dr Randa Ngo, page Dr Willeen Cass to notify of situation. Dr Willeen Cass states to give him a little more time to recover from anesthesia and reassess in an hour.

## 2021-04-14 NOTE — Anesthesia Preprocedure Evaluation (Signed)
Anesthesia Evaluation  Patient identified by MRN, date of birth, ID band Patient awake    Reviewed: Allergy & Precautions, H&P , NPO status , Patient's Chart, lab work & pertinent test results, reviewed documented beta blocker date and time   Airway Mallampati: III  TM Distance: >3 FB Neck ROM: full    Dental  (+) Teeth Intact   Pulmonary neg pulmonary ROS, Current Smoker,    Pulmonary exam normal        Cardiovascular Exercise Tolerance: Good hypertension, On Medications negative cardio ROS Normal cardiovascular exam Rhythm:regular Rate:Normal     Neuro/Psych negative neurological ROS  negative psych ROS   GI/Hepatic negative GI ROS, Neg liver ROS,   Endo/Other  negative endocrine ROS  Renal/GU negative Renal ROS  negative genitourinary   Musculoskeletal   Abdominal   Peds  Hematology negative hematology ROS (+)   Anesthesia Other Findings Past Medical History: 12/04/2020: COVID-19     Comment:  Asymptomatic No date: Hypertension History reviewed. No pertinent surgical history. BMI    Body Mass Index: 27.12 kg/m     Reproductive/Obstetrics negative OB ROS                             Anesthesia Physical Anesthesia Plan  ASA: II  Anesthesia Plan: General ETT   Post-op Pain Management:    Induction:   PONV Risk Score and Plan: 2  Airway Management Planned:   Additional Equipment:   Intra-op Plan:   Post-operative Plan:   Informed Consent: I have reviewed the patients History and Physical, chart, labs and discussed the procedure including the risks, benefits and alternatives for the proposed anesthesia with the patient or authorized representative who has indicated his/her understanding and acceptance.     Dental Advisory Given  Plan Discussed with: CRNA  Anesthesia Plan Comments:         Anesthesia Quick Evaluation

## 2021-04-14 NOTE — Progress Notes (Signed)
Received patient from PACU at approximately 1400 s/p bilateral tonsillectomy. Patient difficult to arouse, was able to stand with 3 assist and get to the recliner. Alert to name and place only. Able to take a cup and drink some water, continues to sit in chair with eyes closed, continues to respond to voice. Vital have been monitored with BP 143/98, Hr- 75, R- 16, O2 Sats - 96 %, denies pain, patient is not speaking due to procedure. Anesthesiologist has been notified, will come and assess patient.

## 2021-04-14 NOTE — Transfer of Care (Signed)
Immediate Anesthesia Transfer of Care Note  Patient: Micheal Baxter  Procedure(s) Performed: TONSILLECTOMY (Bilateral )  Patient Location: PACU  Anesthesia Type:General  Level of Consciousness: drowsy and patient cooperative  Airway & Oxygen Therapy: Patient Spontanous Breathing and Patient connected to face mask oxygen  Post-op Assessment: Report given to RN and Post -op Vital signs reviewed and stable  Post vital signs: Reviewed and stable  Last Vitals:  Vitals Value Taken Time  BP 134/95 04/14/21 1245  Temp    Pulse 84 04/14/21 1248  Resp 15 04/14/21 1248  SpO2 98 % 04/14/21 1248  Vitals shown include unvalidated device data.  Last Pain:  Vitals:   04/14/21 1230  TempSrc:   PainSc: 0-No pain         Complications: No complications documented.

## 2021-04-14 NOTE — Anesthesia Procedure Notes (Signed)
Procedure Name: Intubation Date/Time: 04/14/2021 11:38 AM Performed by: Jonna Clark, CRNA Pre-anesthesia Checklist: Patient identified, Patient being monitored, Timeout performed, Emergency Drugs available and Suction available Patient Re-evaluated:Patient Re-evaluated prior to induction Oxygen Delivery Method: Circle system utilized Preoxygenation: Pre-oxygenation with 100% oxygen Induction Type: IV induction Ventilation: Mask ventilation without difficulty Laryngoscope Size: Mac and 4 Grade View: Grade I Tube type: Oral Rae Tube size: 7.5 mm Number of attempts: 1 Airway Equipment and Method: Stylet Placement Confirmation: ETT inserted through vocal cords under direct vision,  positive ETCO2 and breath sounds checked- equal and bilateral Secured at: 23 cm Tube secured with: Tape Dental Injury: Teeth and Oropharynx as per pre-operative assessment

## 2021-04-14 NOTE — Op Note (Signed)
04/14/2021  12:18 PM    Micheal Baxter  [id number]   Pre-Op Diagnosis:  chronic tonsillitis  Post-op Diagnosis: chronic tonsillitis  Procedure: Tonsillectomy  Surgeon:  Sandi Mealy., MD  Anesthesia:  General endotracheal  EBL:  Less than 25 cc  Complications:  None  Findings: 2+ cryptic tonsils with right tonsil scarring from prior inflammation  Procedure: The patient was taken to the Operating Room and placed in the supine position.  After induction of general endotracheal anesthesia, the table was turned 90 degrees and the patient was draped in the usual fashion  with the eyes protected.  A mouth gag was inserted into the oral cavity to open the mouth, and examination of the oropharynx showed the uvula was non-bifid. The palate was palpated, and there was no evidence of submucous cleft. Examination of the nasopharynx showed no obstructing adenoids. The right tonsil was grasped with an Allis clamp and resected from the tonsillar fossa in the usual fashion with the Bovie. The left tonsil was resected in the same fashion. The Bovie was used to obtain hemostasis. Each tonsillar fossa was then carefully injected with 0.25% marcaine with epinephrine, 1:200,000, avoiding intravascular injection. The nose and throat were irrigated and suctioned to remove any  blood clot. The mouth gag was  removed with no evidence of active bleeding.  The patient was then returned to the anesthesiologist for awakening, and was taken to the Recovery Room in stable condition.  Cultures:  None.  Specimens:  Tonsils.  Disposition:   PACU to home  Plan: Soft, bland diet and push fluids. Take pain medications and prednisone as prescribed. No strenuous activity for 2 weeks. Follow-up in 3 weeks.  Sandi Mealy 04/14/2021 12:18 PM

## 2021-04-14 NOTE — Progress Notes (Signed)
Micheal Baxter is more alert and following commands but still groggy. Was able to void independently and answering questions appropriately. VSS. Per Dr Willeen Cass and Dr Randa Ngo, okay to d/c to home to mother's care.

## 2021-04-14 NOTE — Discharge Instructions (Signed)

## 2021-04-14 NOTE — H&P (Signed)
History and physical reviewed and will be scanned in later. No change in medical status reported by the patient or family, appears stable for surgery. All questions regarding the procedure answered, and patient (or family if a child) expressed understanding of the procedure. ? ?Micheal Baxter Micheal Baxter ?@TODAY@ ?

## 2021-04-15 ENCOUNTER — Encounter: Payer: Self-pay | Admitting: Otolaryngology

## 2021-04-15 LAB — SURGICAL PATHOLOGY

## 2021-04-15 NOTE — Anesthesia Postprocedure Evaluation (Signed)
Anesthesia Post Note  Patient: Micheal Baxter  Procedure(s) Performed: TONSILLECTOMY (Bilateral )  Patient location during evaluation: PACU Anesthesia Type: General Level of consciousness: awake and alert Pain management: pain level controlled Vital Signs Assessment: post-procedure vital signs reviewed and stable Respiratory status: spontaneous breathing, nonlabored ventilation, respiratory function stable and patient connected to nasal cannula oxygen Cardiovascular status: blood pressure returned to baseline and stable Postop Assessment: no apparent nausea or vomiting Anesthetic complications: no   No complications documented.   Last Vitals:  Vitals:   04/14/21 1427 04/14/21 1629  BP: (!) 143/98 (!) 158/98  Pulse: 75 74  Resp: 16 15  Temp:  (!) 36.3 C  SpO2: 96% 97%    Last Pain:  Vitals:   04/14/21 1629  TempSrc: Temporal  PainSc:                  [name]

## 2021-04-19 ENCOUNTER — Ambulatory Visit: Payer: Medicaid Other | Admitting: Pharmacy Technician

## 2021-04-19 ENCOUNTER — Other Ambulatory Visit: Payer: Self-pay

## 2021-04-19 DIAGNOSIS — Z79899 Other long term (current) drug therapy: Secondary | ICD-10-CM

## 2021-04-19 NOTE — Progress Notes (Signed)
Completed [facility] application and contract.  Patient agreed to all terms of the [facility] contract.    Patient approved to receive medication assistance at [facility] until time for re-certification in 2023, and as long as eligibility criteria continues to be met.    Provided patient with community resource material based on his particular needs.    Micheal Baxter J. Joylyn Duggin Care Manager [facility]  

## 2021-04-28 ENCOUNTER — Other Ambulatory Visit: Payer: Self-pay

## 2021-06-08 ENCOUNTER — Telehealth: Payer: Self-pay | Admitting: Gerontology

## 2021-06-08 NOTE — Telephone Encounter (Signed)
Scheduled patient a follow up appt per staff message for 7/21 at 10 am.  Patient confirmed appt date and time. - RM

## 2021-06-17 ENCOUNTER — Other Ambulatory Visit: Payer: Self-pay

## 2021-06-17 ENCOUNTER — Ambulatory Visit: Payer: Medicaid Other | Admitting: Gerontology

## 2021-06-17 ENCOUNTER — Encounter: Payer: Self-pay | Admitting: Gerontology

## 2021-06-17 VITALS — BP 136/97 | HR 81 | Temp 96.8°F | Resp 18 | Ht 68.0 in | Wt 191.0 lb

## 2021-06-17 DIAGNOSIS — I10 Essential (primary) hypertension: Secondary | ICD-10-CM

## 2021-06-17 DIAGNOSIS — Z0000 Encounter for general adult medical examination without abnormal findings: Secondary | ICD-10-CM

## 2021-06-17 MED ORDER — AMLODIPINE BESYLATE 5 MG PO TABS: 5.0000 mg | ORAL_TABLET | Freq: Every day | ORAL | 0 refills | Status: DC

## 2021-06-17 MED ORDER — LOSARTAN POTASSIUM 50 MG PO TABS: 75.0000 mg | ORAL_TABLET | Freq: Every day | ORAL | 0 refills | Status: DC

## 2021-06-17 NOTE — Progress Notes (Signed)
Established Patient Office Visit  Subjective:  Patient ID: Micheal Baxter, male    DOB: [birth year]  Age: 50 y.o. MRN: [id number]  CC:  Chief Complaint  Patient presents with   Follow-up    Follow-up from lab/establish care visit 02/25/21    HPI Micheal Baxter is a 50 year old male who has history of hypertension, presents for routine follow-up visit and medication refill.  He missed his lab appointment, and has been out of his amlodipine and losartan.  He does not check his blood pressure at home, and states that he continues to make healthy lifestyle choices.  He denies chest pain, palpitation, headache, dizziness and vision changes. He had Tonsillectomy done on 04/14/21 by Dr Geanie Logan.  Overall he states that he is doing well and offers no further complaint.   Past Medical History:  Diagnosis Date   COVID-19 12/04/2020   Asymptomatic   Hypertension     Past Surgical History:  Procedure Laterality Date   TONSILLECTOMY Bilateral 04/14/2021   Procedure: TONSILLECTOMY;  Surgeon: Geanie Logan, MD;  Location: [facility] ORS;  Service: ENT;  Laterality: Bilateral;    Family History  Problem Relation Age of Onset   Healthy Mother    Healthy Father 55   Hypertension Maternal Grandfather    Diabetes Maternal Grandfather    Lung cancer Maternal Grandfather     Social History   Socioeconomic History   Marital status: Single    Spouse name: Not on file   Number of children: 7   Years of education: Not on file   Highest education level: Not on file  Occupational History   Not on file  Tobacco Use   Smoking status: Never   Smokeless tobacco: Never  Vaping Use   Vaping Use: Former   Start date: 12/29/2020  Substance and Sexual Activity   Alcohol use: Not Currently   Drug use: Not Currently    Types: Marijuana   Sexual activity: Yes    Birth control/protection: Condom  Other Topics Concern   Not on file  Social History Narrative   Not on file   Social Determinants  of Health   Financial Resource Strain: Not on file  Food Insecurity: No Food Insecurity   Worried About Running Out of Food in the Last Year: Never true   Ran Out of Food in the Last Year: Never true  Transportation Needs: No Transportation Needs   Lack of Transportation (Medical): No   Lack of Transportation (Non-Medical): No  Physical Activity: Not on file  Stress: Not on file  Social Connections: Not on file  Intimate Partner Violence: Not on file    Outpatient Medications Prior to Visit  Medication Sig Dispense Refill   amLODipine (NORVASC) 5 MG tablet TAKE ONE TABLET BY MOUTH EVERY DAY (Patient not taking: No sig reported) 30 tablet 0   HYDROcodone-acetaminophen (HYCET) 7.5-325 mg/15 ml solution 10-15 cc PO every 4-6 hours as needed for pain (Patient not taking: Reported on 06/17/2021) 300 mL 0   losartan (COZAAR) 50 MG tablet TAKE 1 AND 1/2 TABLETS (75 MG TOTAL) BY MOUTH EVERY DAY (Patient not taking: Reported on 06/17/2021) 45 tablet 0   polyethylene glycol-electrolytes (GAVILYTE-N WITH FLAVOR PACK) 420 g solution Drink one 8 oz glass every 20 mins until entire container is finished starting at 5:00pm on Sunday (Patient not taking: Reported on 06/17/2021) 4000 mL 0   prednisoLONE (ORAPRED) 15 MG/5ML solution 10 cc PO BID x 3 days, then 5 cc PO  BID x 3 days, then 5 cc PO QD x 3 days (Patient not taking: Reported on 06/17/2021) 120 mL 0   No facility-administered medications prior to visit.    Allergies  Allergen Reactions   No Known Allergies     ROS Review of Systems  Constitutional: Negative.   Eyes: Negative.   Respiratory: Negative.    Cardiovascular: Negative.   Skin: Negative.   Neurological: Negative.   Psychiatric/Behavioral: Negative.       Objective:    Physical Exam HENT:     Head: Normocephalic and atraumatic.  Eyes:     Extraocular Movements: Extraocular movements intact.     Conjunctiva/sclera: Conjunctivae normal.     Pupils: Pupils are equal, round,  and reactive to light.  Cardiovascular:     Rate and Rhythm: Normal rate and regular rhythm.     Pulses: Normal pulses.     Heart sounds: Normal heart sounds.  Pulmonary:     Effort: Pulmonary effort is normal.     Breath sounds: Normal breath sounds.  Neurological:     General: No focal deficit present.     Mental Status: He is alert and oriented to person, place, and time. Mental status is at baseline.  Psychiatric:        Mood and Affect: Mood normal.        Behavior: Behavior normal.        Thought Content: Thought content normal.        Judgment: Judgment normal.    BP (!) 136/97 (BP Location: Right Arm, Patient Position: Sitting, Cuff Size: Small)   Pulse 81   Temp (!) 96.8 F (36 C)   Resp 18   Ht 5\' 8"  (1.727 m)   Wt 191 lb (86.6 kg)   SpO2 93%   BMI 29.04 kg/m  Wt Readings from Last 3 Encounters:  06/17/21 191 lb (86.6 kg)  04/14/21 189 lb (85.7 kg)  04/06/21 189 lb (85.7 kg)     Health Maintenance Due  Topic Date Due   COVID-19 Vaccine (1) Never done   Pneumococcal Vaccine 93-61 Years old (1 - PCV) Never done   Hepatitis C Screening  Never done   TETANUS/TDAP  Never done   COLONOSCOPY (Pts 45-20yrs Insurance coverage will need to be confirmed)  Never done    There are no preventive care reminders to display for this patient.  No results found for: TSH Lab Results  Component Value Date   WBC 15.7 (H) 02/22/2021   HGB 13.6 02/22/2021   HCT 39.5 02/22/2021   MCV 84.0 02/22/2021   PLT 269 02/22/2021   Lab Results  Component Value Date   NA 138 02/22/2021   K 3.8 02/22/2021   CO2 27 02/22/2021   GLUCOSE 176 (H) 02/22/2021   BUN 13 02/22/2021   CREATININE 1.23 02/22/2021   BILITOT 1.1 02/22/2021   ALKPHOS 63 02/22/2021   AST 15 02/22/2021   ALT 13 02/22/2021   PROT 8.0 02/22/2021   ALBUMIN 3.7 02/22/2021   CALCIUM 9.2 02/22/2021   ANIONGAP 11 02/22/2021   No results found for: CHOL No results found for: HDL No results found for: LDLCALC No  results found for: TRIG No results found for: CHOLHDL No results found for: 02/24/2021    Assessment & Plan:   1. Essential hypertension -His blood pressure is under control, he will continue on current medication, DASH diet and exercise as tolerated.  He was advised to notify clinic with any symptoms. -  amLODipine (NORVASC) 5 MG tablet; TAKE ONE TABLET BY MOUTH EVERY DAY  Dispense: 30 tablet; Refill: 0 - losartan (COZAAR) 50 MG tablet; Take 1.5 tablets (75 mg total) by mouth daily.  Dispense: 45 tablet; Refill: 0  2. Health care maintenance -Routine labs will be checked. - HgB A1c; Future - Lipid panel; Future - Acute Viral Hepatitis (HAV, HBV, HCV); Future     Follow-up: Return in about 1 month (around 07/21/2021), or if symptoms worsen or fail to improve.    Lenord Fralix Trellis Paganini, NP

## 2021-06-17 NOTE — Patient Instructions (Signed)
https://www.nhlbi.nih.gov/files/docs/public/heart/dash_brief.pdf">  DASH Eating Plan DASH stands for Dietary Approaches to Stop Hypertension. The DASH eating plan is a healthy eating plan that has been shown to: Reduce high blood pressure (hypertension). Reduce your risk for type 2 diabetes, heart disease, and stroke. Help with weight loss. What are tips for following this plan? Reading food labels Check food labels for the amount of salt (sodium) per serving. Choose foods with less than 5 percent of the Daily Value of sodium. Generally, foods with less than 300 milligrams (mg) of sodium per serving fit into this eating plan. To find whole grains, look for the word "whole" as the first word in the ingredient list. Shopping Buy products labeled as "low-sodium" or "no salt added." Buy fresh foods. Avoid canned foods and pre-made or frozen meals. Cooking Avoid adding salt when cooking. Use salt-free seasonings or herbs instead of table salt or sea salt. Check with your health care provider or pharmacist before using salt substitutes. Do not fry foods. Cook foods using healthy methods such as baking, boiling, grilling, roasting, and broiling instead. Cook with heart-healthy oils, such as olive, canola, avocado, soybean, or sunflower oil. Meal planning  Eat a balanced diet that includes: 4 or more servings of fruits and 4 or more servings of vegetables each day. Try to fill one-half of your plate with fruits and vegetables. 6-8 servings of whole grains each day. Less than 6 oz (170 g) of lean meat, poultry, or fish each day. A 3-oz (85-g) serving of meat is about the same size as a deck of cards. One egg equals 1 oz (28 g). 2-3 servings of low-fat dairy each day. One serving is 1 cup (237 mL). 1 serving of nuts, seeds, or beans 5 times each week. 2-3 servings of heart-healthy fats. Healthy fats called omega-3 fatty acids are found in foods such as walnuts, flaxseeds, fortified milks, and eggs.  These fats are also found in cold-water fish, such as sardines, salmon, and mackerel. Limit how much you eat of: Canned or prepackaged foods. Food that is high in trans fat, such as some fried foods. Food that is high in saturated fat, such as fatty meat. Desserts and other sweets, sugary drinks, and other foods with added sugar. Full-fat dairy products. Do not salt foods before eating. Do not eat more than 4 egg yolks a week. Try to eat at least 2 vegetarian meals a week. Eat more home-cooked food and less restaurant, buffet, and fast food.  Lifestyle When eating at a restaurant, ask that your food be prepared with less salt or no salt, if possible. If you drink alcohol: Limit how much you use to: 0-1 drink a day for women who are not pregnant. 0-2 drinks a day for men. Be aware of how much alcohol is in your drink. In the [place] one drink equals one 12 oz bottle of beer (355 mL), one 5 oz glass of wine (148 mL), or one 1 oz glass of hard liquor (44 mL). General information Avoid eating more than 2,300 mg of salt a day. If you have hypertension, you may need to reduce your sodium intake to 1,500 mg a day. Work with your health care provider to maintain a healthy body weight or to lose weight. Ask what an ideal weight is for you. Get at least 30 minutes of exercise that causes your heart to beat faster (aerobic exercise) most days of the week. Activities may include walking, swimming, or biking. Work with your health care provider   or dietitian to adjust your eating plan to your individual calorie needs. What foods should I eat? Fruits All fresh, dried, or frozen fruit. Canned fruit in natural juice (without addedsugar). Vegetables Fresh or frozen vegetables (raw, steamed, roasted, or grilled). Low-sodium or reduced-sodium tomato and vegetable juice. Low-sodium or reduced-sodium tomatosauce and tomato paste. Low-sodium or reduced-sodium canned vegetables. Grains Whole-grain or  whole-wheat bread. Whole-grain or whole-wheat pasta. Brown rice. Oatmeal. Quinoa. Bulgur. Whole-grain and low-sodium cereals. Pita bread.Low-fat, low-sodium crackers. Whole-wheat flour tortillas. Meats and other proteins Skinless chicken or turkey. Ground chicken or turkey. Pork with fat trimmed off. Fish and seafood. Egg whites. Dried beans, peas, or lentils. Unsalted nuts, nut butters, and seeds. Unsalted canned beans. Lean cuts of beef with fat trimmed off. Low-sodium, lean precooked or cured meat, such as sausages or meatloaves. Dairy Low-fat (1%) or fat-free (skim) milk. Reduced-fat, low-fat, or fat-free cheeses. Nonfat, low-sodium ricotta or cottage cheese. Low-fat or nonfatyogurt. Low-fat, low-sodium cheese. Fats and oils Soft margarine without trans fats. Vegetable oil. Reduced-fat, low-fat, or light mayonnaise and salad dressings (reduced-sodium). Canola, safflower, olive, avocado, soybean, andsunflower oils. Avocado. Seasonings and condiments Herbs. Spices. Seasoning mixes without salt. Other foods Unsalted popcorn and pretzels. Fat-free sweets. The items listed above may not be a complete list of foods and beverages you can eat. Contact a dietitian for more information. What foods should I avoid? Fruits Canned fruit in a light or heavy syrup. Fried fruit. Fruit in cream or buttersauce. Vegetables Creamed or fried vegetables. Vegetables in a cheese sauce. Regular canned vegetables (not low-sodium or reduced-sodium). Regular canned tomato sauce and paste (not low-sodium or reduced-sodium). Regular tomato and vegetable juice(not low-sodium or reduced-sodium). Pickles. Olives. Grains Baked goods made with fat, such as croissants, muffins, or some breads. Drypasta or rice meal packs. Meats and other proteins Fatty cuts of meat. Ribs. Fried meat. Bacon. Bologna, salami, and other precooked or cured meats, such as sausages or meat loaves. Fat from the back of a pig (fatback). Bratwurst.  Salted nuts and seeds. Canned beans with added salt. Canned orsmoked fish. Whole eggs or egg yolks. Chicken or turkey with skin. Dairy Whole or 2% milk, cream, and half-and-half. Whole or full-fat cream cheese. Whole-fat or sweetened yogurt. Full-fat cheese. Nondairy creamers. Whippedtoppings. Processed cheese and cheese spreads. Fats and oils Butter. Stick margarine. Lard. Shortening. Ghee. Bacon fat. Tropical oils, suchas coconut, palm kernel, or palm oil. Seasonings and condiments Onion salt, garlic salt, seasoned salt, table salt, and sea salt. Worcestershire sauce. Tartar sauce. Barbecue sauce. Teriyaki sauce. Soy sauce, including reduced-sodium. Steak sauce. Canned and packaged gravies. Fish sauce. Oyster sauce. Cocktail sauce. Store-bought horseradish. Ketchup. Mustard. Meat flavorings and tenderizers. Bouillon cubes. Hot sauces. Pre-made or packaged marinades. Pre-made or packaged taco seasonings. Relishes. Regular saladdressings. Other foods Salted popcorn and pretzels. The items listed above may not be a complete list of foods and beverages you should avoid. Contact a dietitian for more information. Where to find more information National Heart, Lung, and Blood Institute: www.nhlbi.nih.gov American Heart Association: www.heart.org Academy of Nutrition and Dietetics: www.eatright.org National Kidney Foundation: www.kidney.org Summary The DASH eating plan is a healthy eating plan that has been shown to reduce high blood pressure (hypertension). It may also reduce your risk for type 2 diabetes, heart disease, and stroke. When on the DASH eating plan, aim to eat more fresh fruits and vegetables, whole grains, lean proteins, low-fat dairy, and heart-healthy fats. With the DASH eating plan, you should limit salt (sodium) intake to 2,300   mg a day. If you have hypertension, you may need to reduce your sodium intake to 1,500 mg a day. Work with your health care provider or dietitian to adjust  your eating plan to your individual calorie needs. This information is not intended to replace advice given to you by your health care provider. Make sure you discuss any questions you have with your healthcare provider. Document Revised: 10/18/2019 Document Reviewed: 10/18/2019 Elsevier Patient Education  2022 Elsevier Inc.  

## 2021-07-13 ENCOUNTER — Other Ambulatory Visit: Payer: Medicaid Other | Admitting: Gerontology

## 2021-07-14 ENCOUNTER — Other Ambulatory Visit: Payer: Medicaid Other

## 2021-07-15 ENCOUNTER — Other Ambulatory Visit: Payer: Medicaid Other

## 2021-07-21 ENCOUNTER — Ambulatory Visit: Payer: Medicaid Other | Admitting: Adult Health

## 2021-07-21 ENCOUNTER — Ambulatory Visit: Payer: Medicaid Other | Admitting: Gerontology

## 2021-07-24 ENCOUNTER — Emergency Department
Admission: EM | Admit: 2021-07-24 | Discharge: 2021-07-24 | Disposition: A | Discharge status: Home / Self Care | Payer: Medicaid Other | Attending: Emergency Medicine | Admitting: Emergency Medicine

## 2021-07-24 ENCOUNTER — Other Ambulatory Visit: Payer: Self-pay

## 2021-07-24 DIAGNOSIS — R369 Urethral discharge, unspecified: Secondary | ICD-10-CM | POA: Diagnosis not present

## 2021-07-24 DIAGNOSIS — Z79899 Other long term (current) drug therapy: Secondary | ICD-10-CM | POA: Insufficient documentation

## 2021-07-24 DIAGNOSIS — Z8616 Personal history of COVID-19: Secondary | ICD-10-CM | POA: Insufficient documentation

## 2021-07-24 DIAGNOSIS — Z202 Contact with and (suspected) exposure to infections with a predominantly sexual mode of transmission: Secondary | ICD-10-CM | POA: Insufficient documentation

## 2021-07-24 DIAGNOSIS — I10 Essential (primary) hypertension: Secondary | ICD-10-CM | POA: Insufficient documentation

## 2021-07-24 DIAGNOSIS — R300 Dysuria: Secondary | ICD-10-CM | POA: Diagnosis not present

## 2021-07-24 LAB — URINALYSIS, COMPLETE (UACMP) WITH MICROSCOPIC
Bacteria, UA: NONE SEEN
Bilirubin Urine: NEGATIVE
Glucose, UA: NEGATIVE mg/dL
Ketones, ur: NEGATIVE mg/dL
Nitrite: NEGATIVE
Protein, ur: 30 mg/dL — AB
Specific Gravity, Urine: 1.027 (ref 1.005–1.030)
WBC, UA: >50 WBC/hpf — ABNORMAL HIGH (ref 0–5)
pH: 6 (ref 5.0–8.0)

## 2021-07-24 LAB — CHLAMYDIA/NGC RT PCR (ARMC ONLY)
Chlamydia Tr: DETECTED — AB
N gonorrhoeae: DETECTED — AB

## 2021-07-24 MED ORDER — KETOROLAC TROMETHAMINE 0.5 % OP SOLN: 1.0000 [drp] | Freq: Four times a day (QID) | OPHTHALMIC | 0 refills | Status: DC

## 2021-07-24 MED ORDER — DOXYCYCLINE HYCLATE 100 MG PO TABS: 100.0000 mg | ORAL_TABLET | Freq: Two times a day (BID) | ORAL | 0 refills | Status: DC

## 2021-07-24 MED ORDER — LIDOCAINE HCL (PF) 1 % IJ SOLN
INTRAMUSCULAR | Status: AC
  Administered 2021-07-24: 1.05 mL
  Filled 2021-07-24: qty 5

## 2021-07-24 MED ORDER — AZELASTINE HCL 0.05 % OP SOLN: 1.0000 [drp] | Freq: Two times a day (BID) | OPHTHALMIC | 1 refills | Status: DC

## 2021-07-24 MED ORDER — CEFTRIAXONE SODIUM 1 G IJ SOLR
500.0000 mg | Freq: Once | INTRAMUSCULAR | Status: AC
  Administered 2021-07-24: 500 mg via INTRAMUSCULAR
  Filled 2021-07-24: qty 10

## 2021-07-24 NOTE — ED Notes (Signed)
Urine sent, patient states he forgot to tell the doctor about "my other problem," complains of left lower eye being red.  Provider made aware.

## 2021-07-24 NOTE — ED Triage Notes (Signed)
Pt comes pov for std check. Denies symptoms. Sexual encounter was last night.

## 2021-07-24 NOTE — ED Provider Notes (Signed)
[facility] Emergency Department Provider Note  ____________________________________________  Time seen: Approximately 3:04 PM  I have reviewed the triage vital signs and the nursing notes.   HISTORY  Chief Complaint std check    HPI Micheal Baxter is a 50 y.o. male who presents the emergency department complaining of discharge and dysuria after an encounter with an individual who has chlamydia.  Patient states that it was unprotected sex.  He is now experiencing discharge and dysuria.  No testicular pain.  No abdominal pain.  No fevers or chills.  Patient denies any rash or lesions to the genitalia.  No other complaints at this time.       Past Medical History:  Diagnosis Date   COVID-19 12/04/2020   Asymptomatic   Hypertension     Patient Active Problem List   Diagnosis Date Noted   Essential hypertension 02/25/2021   Encounter to establish care 02/25/2021   Bilateral knee pain 02/25/2021   Strep pharyngitis    Peritonsillar abscess 02/22/2021    Past Surgical History:  Procedure Laterality Date   TONSILLECTOMY Bilateral 04/14/2021   Procedure: TONSILLECTOMY;  Surgeon: Geanie Logan, MD;  Location: [facility] ORS;  Service: ENT;  Laterality: Bilateral;    Prior to Admission medications   Medication Sig Start Date End Date Taking? Authorizing Provider  doxycycline (VIBRA-TABS) 100 MG tablet Take 1 tablet (100 mg total) by mouth 2 (two) times daily. 07/24/21  Yes Diannie Willner, Delorise Royals, PA-C  amLODipine (NORVASC) 5 MG tablet TAKE ONE TABLET BY MOUTH EVERY DAY 06/17/21 06/17/22  Iloabachie, Chioma E, NP  losartan (COZAAR) 50 MG tablet Take 1.5 tablets (75 mg total) by mouth daily. 06/17/21 06/17/22  Iloabachie, Chioma E, NP    Allergies No known allergies  Family History  Problem Relation Age of Onset   Healthy Mother    Healthy Father 60   Hypertension Maternal Grandfather    Diabetes Maternal Grandfather    Lung cancer Maternal Grandfather      Social History Social History   Tobacco Use   Smoking status: Never   Smokeless tobacco: Never  Vaping Use   Vaping Use: Former   Start date: 12/29/2020  Substance Use Topics   Alcohol use: Not Currently   Drug use: Not Currently    Types: Marijuana     Review of Systems  Constitutional: No fever/chills Eyes: No visual changes. No discharge ENT: No upper respiratory complaints. Cardiovascular: no chest pain. Respiratory: no cough. No SOB. Gastrointestinal: No abdominal pain.  No nausea, no vomiting.  No diarrhea.  No constipation. Genitourinary: Positive for dysuria and penile discharge Musculoskeletal: Negative for musculoskeletal pain. Skin: Negative for rash, abrasions, lacerations, ecchymosis. Neurological: Negative for headaches, focal weakness or numbness.  10 System ROS otherwise negative.  ____________________________________________   PHYSICAL EXAM:  VITAL SIGNS: ED Triage Vitals  Enc Vitals Group     BP 07/24/21 1341 (!) 149/107     Pulse Rate 07/24/21 1341 79     Resp 07/24/21 1341 18     Temp 07/24/21 1341 98.7 F (37.1 C)     Temp Source 07/24/21 1341 Oral     SpO2 07/24/21 1341 99 %     Weight 07/24/21 1325 189 lb 9.5 oz (86 kg)     Height 07/24/21 1325 5\' 8"  (1.727 m)     Head Circumference --      Peak Flow --      Pain Score 07/24/21 1325 0     Pain  Loc --      Pain Edu? --      Excl. in GC? --      Constitutional: Alert and oriented. Well appearing and in no acute distress. Eyes: Conjunctivae are normal. PERRL. EOMI. Head: Atraumatic. ENT:      Ears:       Nose: No congestion/rhinnorhea.      Mouth/Throat: Mucous membranes are moist.  Neck: No stridor.    Cardiovascular: Normal rate, regular rhythm. Normal S1 and S2.  Good peripheral circulation. Respiratory: Normal respiratory effort without tachypnea or retractions. Lungs CTAB. Good air entry to the bases with no decreased or absent breath sounds. Gastrointestinal: Bowel sounds  4 quadrants. Soft and nontender to palpation. No guarding or rigidity. No palpable masses. No distention. No CVA tenderness. Patient defers GU exam Musculoskeletal: Full range of motion to all extremities. No gross deformities appreciated. Neurologic:  Normal speech and language. No gross focal neurologic deficits are appreciated.  Skin:  Skin is warm, dry and intact. No rash noted. Psychiatric: Mood and affect are normal. Speech and behavior are normal. Patient exhibits appropriate insight and judgement.   ____________________________________________   LABS (all labs ordered are listed, but only abnormal results are displayed)  Labs Reviewed  CHLAMYDIA/NGC RT PCR ([facility] ONLY)            URINALYSIS, COMPLETE (UACMP) WITH MICROSCOPIC   ____________________________________________  EKG   ____________________________________________  RADIOLOGY   No results found.  ____________________________________________    PROCEDURES  Procedure(s) performed:    Procedures    Medications  cefTRIAXone (ROCEPHIN) injection 500 mg (500 mg Intramuscular Given 07/24/21 1518)  lidocaine (PF) (XYLOCAINE) 1 % injection (1.05 mLs  Given 07/24/21 1519)     ____________________________________________   INITIAL IMPRESSION / ASSESSMENT AND PLAN / ED COURSE  Pertinent labs & imaging results that were available during my care of the patient were reviewed by me and considered in my medical decision making (see chart for details).  Review of the [place] CSRS was performed in accordance of the [facility] prior to dispensing any controlled drugs.           Patient's diagnosis is consistent with exposure to STD with penile discharge.  Patient presented to the emergency department after an exposure to somebody who has chlamydia.  Patient is symptomatic at this time.  Labs will be taken but patient will be treated empirically with Rocephin and doxycycline..  Patient will follow results on MyChart.   Follow-up with primary care as needed.  Patient is given ED precautions to return to the ED for any worsening or new symptoms.     ____________________________________________  FINAL CLINICAL IMPRESSION(S) / ED DIAGNOSES  Final diagnoses:  Exposure to sexually transmitted disease (STD)  Penile discharge      NEW MEDICATIONS STARTED DURING THIS VISIT:  ED Discharge Orders          Ordered    doxycycline (VIBRA-TABS) 100 MG tablet  2 times daily        07/24/21 1538                This chart was dictated using voice recognition software/Dragon. Despite best efforts to proofread, errors can occur which can change the meaning. Any change was purely unintentional.    Racheal Patches, PA-C 07/24/21 1540    Delton Prairie, MD 07/24/21 [phone]

## 2021-07-26 ENCOUNTER — Telehealth: Payer: Self-pay | Admitting: Emergency Medicine

## 2021-07-26 NOTE — Telephone Encounter (Signed)
Called patient to inform of std results.  No answer and voicemail box is full.

## 2021-08-17 ENCOUNTER — Ambulatory Visit: Payer: Medicaid Other

## 2021-08-17 ENCOUNTER — Other Ambulatory Visit: Payer: Self-pay

## 2021-08-17 DIAGNOSIS — Z79899 Other long term (current) drug therapy: Secondary | ICD-10-CM

## 2021-08-18 ENCOUNTER — Telehealth: Payer: Self-pay

## 2021-08-18 NOTE — Telephone Encounter (Signed)
Called patient to complete annual MTM. Unable to reach patient by telephone. Telephone mailbox full, therefore unable to leave voicemail.   Per chart review, it seems patient has obtained insurance (Medicaid [employer]). If ever deemed eligible again, will follow up.   Reylene Stauder A [employer] [place], PharmD Pharmacy Resident  08/18/2021 9:55 AM 

## 2021-08-18 NOTE — Progress Notes (Deleted)
Called patient to complete annual MTM. Unable to reach patient by telephone. Telephone mailbox full, therefore unable to leave voicemail.   Per chart review, it seems patient has obtained insurance [facility] [employer] [place]). If ever deemed eligible again, will follow up.   Jaynie Bream, PharmD Pharmacy Resident  08/18/2021 9:55 AM

## 2021-08-18 NOTE — Progress Notes (Deleted)
   Called patient to complete annual MTM. Unable to reach patient by telephone. Telephone mailbox full, therefore unable to leave voicemail.   Per chart review, it seems patient has obtained insurance [facility] [employer] [place]). If ever deemed eligible again, will follow up.   Jaynie Bream, PharmD Pharmacy Resident  08/18/2021 9:55 AM

## 2021-08-18 NOTE — Progress Notes (Signed)
Called patient to complete annual MTM. Unable to reach patient by telephone. Telephone mailbox full, therefore unable to leave voicemail.   Per chart review, it seems patient has obtained insurance (Medicaid [employer]). If ever deemed eligible again, will follow up.   Deryk Bozman A Eleasha Cataldo, PharmD Pharmacy Resident  08/18/2021 9:55 AM 

## 2021-08-31 ENCOUNTER — Other Ambulatory Visit: Payer: Self-pay

## 2021-09-02 ENCOUNTER — Emergency Department
Admission: EM | Admit: 2021-09-02 | Discharge: 2021-09-02 | Disposition: A | Discharge status: Home / Self Care | Payer: Medicaid Other | Attending: Emergency Medicine | Admitting: Emergency Medicine

## 2021-09-02 ENCOUNTER — Other Ambulatory Visit: Payer: Self-pay

## 2021-09-02 ENCOUNTER — Emergency Department: Payer: Medicaid Other

## 2021-09-02 DIAGNOSIS — Z79899 Other long term (current) drug therapy: Secondary | ICD-10-CM | POA: Diagnosis not present

## 2021-09-02 DIAGNOSIS — R519 Headache, unspecified: Secondary | ICD-10-CM | POA: Diagnosis not present

## 2021-09-02 DIAGNOSIS — Z8616 Personal history of COVID-19: Secondary | ICD-10-CM | POA: Diagnosis not present

## 2021-09-02 DIAGNOSIS — I10 Essential (primary) hypertension: Secondary | ICD-10-CM | POA: Diagnosis not present

## 2021-09-02 LAB — CBC
HCT: 39.6 % (ref 39.0–52.0)
Hemoglobin: 13.8 g/dL (ref 13.0–17.0)
MCH: 29.7 pg (ref 26.0–34.0)
MCHC: 34.8 g/dL (ref 30.0–36.0)
MCV: 85.3 fL (ref 80.0–100.0)
Platelets: 241 10*3/uL (ref 150–400)
RBC: 4.64 MIL/uL (ref 4.22–5.81)
RDW: 12.2 % (ref 11.5–15.5)
WBC: 4.4 10*3/uL (ref 4.0–10.5)
nRBC: 0 % (ref 0.0–0.2)

## 2021-09-02 LAB — BASIC METABOLIC PANEL
Anion gap: 7 (ref 5–15)
BUN: 18 mg/dL (ref 6–20)
CO2: 29 mmol/L (ref 22–32)
Calcium: 9.1 mg/dL (ref 8.9–10.3)
Chloride: 105 mmol/L (ref 98–111)
Creatinine, Ser: 1.16 mg/dL (ref 0.61–1.24)
GFR, Estimated: >60 mL/min (ref >60)
Glucose, Bld: 103 mg/dL — ABNORMAL HIGH (ref 70–99)
Potassium: 3.9 mmol/L (ref 3.5–5.1)
Sodium: 141 mmol/L (ref 135–145)

## 2021-09-02 LAB — COOXEMETRY PANEL
Carboxyhemoglobin: 0.8 % (ref 0.5–1.5)
Methemoglobin: 0.5 % (ref 0.0–1.5)

## 2021-09-02 LAB — TROPONIN I (HIGH SENSITIVITY)
Troponin I (High Sensitivity): <2 ng/L (ref <18)
Troponin I (High Sensitivity): 3 ng/L (ref <18)

## 2021-09-02 MED ORDER — SODIUM CHLORIDE 0.9 % IV BOLUS
1000.0000 mL | Freq: Once | INTRAVENOUS | Status: AC
  Administered 2021-09-02: 1000 mL via INTRAVENOUS

## 2021-09-02 MED ORDER — ACETAMINOPHEN 325 MG PO TABS
ORAL_TABLET | ORAL | Status: AC
  Filled 2021-09-02: qty 2

## 2021-09-02 MED ORDER — PROCHLORPERAZINE EDISYLATE 10 MG/2ML IJ SOLN
10.0000 mg | Freq: Once | INTRAMUSCULAR | Status: AC
  Administered 2021-09-02: 10 mg via INTRAVENOUS
  Filled 2021-09-02: qty 2

## 2021-09-02 MED ORDER — ACETAMINOPHEN 325 MG PO TABS
650.0000 mg | ORAL_TABLET | Freq: Once | ORAL | Status: AC
  Administered 2021-09-02: 650 mg via ORAL

## 2021-09-02 NOTE — ED Provider Notes (Signed)
[facility] Emergency Department Provider Note  ____________________________________________   I have reviewed the triage vital signs and the nursing notes.   HISTORY  Chief Complaint Headache   History limited by: Not Limited   HPI Micheal Baxter is a 50 y.o. male who presents to the emergency department today because of concern for bad headache and a syncopal episode. The patient states that his headache started shortly after arriving to work today. Located in the front part of his head. The patient denies similar headaches in the past. It progressively got worse throughout the day. He then sat down in his car and passed out. The patient continues to have severe headache at the time of my exam. Was given some tylenol without any relief. Patient does state that he has history of high blood pressure and is on medication however did not take it this morning.   Records reviewed. Per medical record review patient has a history of hypertension.  Past Medical History:  Diagnosis Date   COVID-19 12/04/2020   Asymptomatic   Hypertension     Patient Active Problem List   Diagnosis Date Noted   Essential hypertension 02/25/2021   Encounter to establish care 02/25/2021   Bilateral knee pain 02/25/2021   Strep pharyngitis    Peritonsillar abscess 02/22/2021    Past Surgical History:  Procedure Laterality Date   TONSILLECTOMY Bilateral 04/14/2021   Procedure: TONSILLECTOMY;  Surgeon: Geanie Logan, MD;  Location: [facility] ORS;  Service: ENT;  Laterality: Bilateral;    Prior to Admission medications   Medication Sig Start Date End Date Taking? Authorizing Provider  amLODipine (NORVASC) 5 MG tablet TAKE ONE TABLET BY MOUTH EVERY DAY 06/17/21 06/17/22  Iloabachie, Chioma E, NP  azelastine (OPTIVAR) 0.05 % ophthalmic solution Place 1 drop into the left eye 2 (two) times daily. 07/24/21   Cuthriell, Delorise Royals, PA-C  doxycycline (VIBRA-TABS) 100 MG tablet Take 1 tablet  (100 mg total) by mouth 2 (two) times daily. 07/24/21   Cuthriell, Delorise Royals, PA-C  ketorolac (ACULAR) 0.5 % ophthalmic solution Place 1 drop into the left eye 4 (four) times daily. 07/24/21   Cuthriell, Delorise Royals, PA-C  losartan (COZAAR) 50 MG tablet Take 1.5 tablets (75 mg total) by mouth daily. 06/17/21 06/17/22  Iloabachie, Chioma E, NP    Allergies No known allergies  Family History  Problem Relation Age of Onset   Healthy Mother    Healthy Father 52   Hypertension Maternal Grandfather    Diabetes Maternal Grandfather    Lung cancer Maternal Grandfather     Social History Social History   Tobacco Use   Smoking status: Never   Smokeless tobacco: Never  Vaping Use   Vaping Use: Former   Start date: 12/29/2020  Substance Use Topics   Alcohol use: Not Currently   Drug use: Not Currently    Types: Marijuana    Review of Systems Constitutional: No fever/chills Eyes: No visual changes. ENT: No sore throat. Cardiovascular: Denies chest pain. Respiratory: Denies shortness of breath. Gastrointestinal: No abdominal pain.  Positive for nausea.  Genitourinary: Negative for dysuria. Musculoskeletal: Negative for back pain. Skin: Negative for rash. Neurological: Positive for headache.  ____________________________________________   PHYSICAL EXAM:  VITAL SIGNS: ED Triage Vitals [09/02/21 1626]  Enc Vitals Group     BP (!) 173/102     Pulse Rate 66     Resp 18     Temp 98.3 F (36.8 C)     Temp Source  Oral     SpO2 98 %     Weight 198 lb 6.6 oz (90 kg)     Height 5\' 8"  (1.727 m)     Head Circumference      Peak Flow      Pain Score 10   Constitutional: Alert and oriented.  Eyes: Conjunctivae are normal.  ENT      Head: Normocephalic and atraumatic.      Nose: No congestion/rhinnorhea.      Mouth/Throat: Mucous membranes are moist.      Neck: No stridor. Hematological/Lymphatic/Immunilogical: No cervical lymphadenopathy. Cardiovascular: Normal rate, regular  rhythm.  No murmurs, rubs, or gallops.  Respiratory: Normal respiratory effort without tachypnea nor retractions. Breath sounds are clear and equal bilaterally. No wheezes/rales/rhonchi. Gastrointestinal: Soft and non tender. No rebound. No guarding.  Genitourinary: Deferred Musculoskeletal: Normal range of motion in all extremities. No lower extremity edema. Neurologic:  Normal speech and language. No gross focal neurologic deficits are appreciated.  Skin:  Skin is warm, dry and intact. No rash noted. Psychiatric: Mood and affect are normal. Speech and behavior are normal. Patient exhibits appropriate insight and judgment.  ____________________________________________    LABS (pertinent positives/negatives)  Trop hs <2 BMP wnl except glu 103 CBC wbc 4.4, hgb 13.8, plt 241 Carboxyhemoglobin 0.8 Methemoglobin 0.5 ____________________________________________   EKG  I, , attending physician, personally viewed and interpreted this EKG  EKG Time: 1620 Rate: 67 Rhythm: normal sinus rhythm Axis: left axis deviation Intervals: qtc 386 QRS: LAFB ST changes: no st elevation Impression: abnormal ekg   ____________________________________________    RADIOLOGY  CT head No acute abnormality  ____________________________________________   PROCEDURES  Procedures  ____________________________________________   INITIAL IMPRESSION / ASSESSMENT AND PLAN / ED COURSE  Pertinent labs & imaging results that were available during my care of the patient were reviewed by me and considered in my medical decision making (see chart for details).  Patient presented to the emergency department today with complaints of bad headache.  CT head without any acute abnormality.  Blood work without any significant electrolyte abnormalities.  No leukocytosis.  Carboxyhemoglobin within normal limits.  Patient was given IV fluids and medication to help with migraine.  He did feel  significant improvement.  At this time somewhat unclear etiology but might of triggered that migraine.  His blood pressure was elevated and he has not been taking his blood pressure medication so this might of played a role.  Given the patient does feel better will plan on discharge.  Did encourage patient to resume his blood pressure medication.   ____________________________________________   FINAL CLINICAL IMPRESSION(S) / ED DIAGNOSES  Final diagnoses:  Bad headache     Note: This dictation was prepared with Dragon dictation. Any transcriptional errors that result from this process are unintentional     1621, MD 09/02/21 2326

## 2021-09-02 NOTE — ED Triage Notes (Signed)
Pt Brought in POV with co high blood pressure, his SO states that his work called and told her that he was passed out in the car. Had to help pt get out of the car. He is diaphoretic, he is c/o a headache

## 2021-09-02 NOTE — ED Triage Notes (Signed)
See first nurse note- Pt to ER after unwitnessed syncopal episode in car after feeling bad at work. Complains of a generalized headache. Reports feeling fatigued. 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 Hx of HTN but per wife is noncompliant.

## 2021-09-02 NOTE — Discharge Instructions (Addendum)
Please seek medical attention for any high fevers, chest pain, shortness of breath, change in behavior, persistent vomiting, bloody stool or any other new or concerning symptoms.  

## 2021-10-28 ENCOUNTER — Telehealth: Payer: Self-pay | Admitting: Pharmacy Technician

## 2021-10-28 NOTE — Telephone Encounter (Signed)
Patient has Medicaid with prescription drug coverage.  No longer meets [facility]'s eligibility criteria.  Pt. notified. Sherilyn Dacosta Care Manager [facility]   [address] [place], [place]  [address]   10/27/21    Desma Paganini [address] [place], [place]  [address]  Dear Desma Maxim:  This is to inform you that you are no longer eligible to receive medication assistance at [facility].  The reason(s) are:    _____Your total gross monthly household income exceeds 250% of the Federal Poverty Level.   _____Tangible assets (savings, checking, stocks/bonds, pension, retirement, etc.) exceeds our limit  _____You are eligible to receive benefits from a Medicare Part "D" plan __X__You have prescription insurance with Medicaid _____You are not an [facility] resident _____Failure to provide all requested documentation (proof of income information for 2022., and/or Patient Intake Application, DOH Attestation, Contract, etc).    We regret that we are unable to help you at this time.  If your prescription coverage is terminated, please contact [facility], so that we may reassess your eligibility for our program.  If you have questions, we may be contacted at [phone].  Thank you,  [facility]

## 2021-11-02 ENCOUNTER — Other Ambulatory Visit: Payer: Self-pay

## 2022-01-13 ENCOUNTER — Telehealth: Payer: Self-pay | Admitting: Emergency Medicine

## 2022-01-13 ENCOUNTER — Emergency Department
Admission: EM | Admit: 2022-01-13 | Discharge: 2022-01-13 | Disposition: A | Discharge status: Home / Self Care | Payer: Medicaid Other | Attending: Emergency Medicine | Admitting: Emergency Medicine

## 2022-01-13 ENCOUNTER — Other Ambulatory Visit: Payer: Self-pay

## 2022-01-13 DIAGNOSIS — N3001 Acute cystitis with hematuria: Secondary | ICD-10-CM

## 2022-01-13 DIAGNOSIS — I10 Essential (primary) hypertension: Secondary | ICD-10-CM | POA: Diagnosis not present

## 2022-01-13 DIAGNOSIS — R350 Frequency of micturition: Secondary | ICD-10-CM | POA: Diagnosis present

## 2022-01-13 LAB — URINALYSIS, ROUTINE W REFLEX MICROSCOPIC
Bilirubin Urine: NEGATIVE
Glucose, UA: NEGATIVE mg/dL
Ketones, ur: NEGATIVE mg/dL
Nitrite: NEGATIVE
Protein, ur: 30 mg/dL — AB
RBC / HPF: >50 RBC/hpf — ABNORMAL HIGH (ref 0–5)
Specific Gravity, Urine: 1.025 (ref 1.005–1.030)
pH: 6 (ref 5.0–8.0)

## 2022-01-13 LAB — BASIC METABOLIC PANEL
Anion gap: 5 (ref 5–15)
BUN: 18 mg/dL (ref 6–20)
CO2: 27 mmol/L (ref 22–32)
Calcium: 8.7 mg/dL — ABNORMAL LOW (ref 8.9–10.3)
Chloride: 106 mmol/L (ref 98–111)
Creatinine, Ser: 1.03 mg/dL (ref 0.61–1.24)
GFR, Estimated: >60 mL/min (ref >60)
Glucose, Bld: 90 mg/dL (ref 70–99)
Potassium: 3.7 mmol/L (ref 3.5–5.1)
Sodium: 138 mmol/L (ref 135–145)

## 2022-01-13 LAB — CBC
HCT: 43.2 % (ref 39.0–52.0)
Hemoglobin: 14.3 g/dL (ref 13.0–17.0)
MCH: 28.4 pg (ref 26.0–34.0)
MCHC: 33.1 g/dL (ref 30.0–36.0)
MCV: 85.9 fL (ref 80.0–100.0)
Platelets: 247 10*3/uL (ref 150–400)
RBC: 5.03 MIL/uL (ref 4.22–5.81)
RDW: 12.3 % (ref 11.5–15.5)
WBC: 5.2 10*3/uL (ref 4.0–10.5)
nRBC: 0 % (ref 0.0–0.2)

## 2022-01-13 LAB — CHLAMYDIA/NGC RT PCR (ARMC ONLY)
Chlamydia Tr: NOT DETECTED
N gonorrhoeae: DETECTED — AB

## 2022-01-13 MED ORDER — CEPHALEXIN 500 MG PO CAPS: 500.0000 mg | ORAL_CAPSULE | Freq: Four times a day (QID) | ORAL | 0 refills | Status: AC

## 2022-01-13 MED ORDER — LIDOCAINE HCL (PF) 1 % IJ SOLN
5.0000 mL | Freq: Once | INTRAMUSCULAR | Status: AC
  Administered 2022-01-13: 5 mL
  Filled 2022-01-13: qty 5

## 2022-01-13 MED ORDER — CEFTRIAXONE SODIUM 1 G IJ SOLR
500.0000 mg | Freq: Once | INTRAMUSCULAR | Status: AC
  Administered 2022-01-13: 500 mg via INTRAMUSCULAR
  Filled 2022-01-13: qty 10

## 2022-01-13 MED ORDER — DOXYCYCLINE HYCLATE 100 MG PO CAPS: 100.0000 mg | ORAL_CAPSULE | Freq: Two times a day (BID) | ORAL | 0 refills | Status: AC

## 2022-01-13 NOTE — Telephone Encounter (Signed)
Called patient to inform of std results.  No answre and VM is full.

## 2022-01-13 NOTE — Discharge Instructions (Signed)
As we discussed we are treating for urinary tract infection with antibiotics will cover gonorrhea and chlamydia as well as a typical urinary tract infection.  Please follow-up with primary care and return for any new or worsening of your symptoms.

## 2022-01-13 NOTE — ED Triage Notes (Addendum)
Pt comes with c/op flank pain. Pt states he passed a kidney stone yesterday and feels he is going to pass another one. Pt states back pain. Pt denies any fever.  Pt also states some discharge that started yesterday and little blood. Pt states frequent urination. Pt denies being sexually active.

## 2022-01-13 NOTE — ED Provider Notes (Signed)
[facility] Provider Note    Event Date/Time   First MD Initiated Contact with Patient 01/13/22 1028     (approximate)   History   Flank Pain   HPI  Micheal Baxter is a 51 y.o. male with a past medical history of hypertension and recurrent left-sided kidney stones who presents for evaluation of some urinary frequency and white discharge.  Patient states he thinks he may have passed a kidney stone as he had fairly sudden onset of left-sided low back pain rating on the left flank typical of his usual kidney stone pain yesterday.  He states he thinks he passed a small stone and that this pain has since resolved but he still has urinary frequency and discharge that started after this.  He denies any new back pain, abdominal pain, fevers, nausea, vomiting, diarrhea, constipation, rash or any other acute sick symptoms.  States he always use protection but was told by his mother he should be tested and treated for STDs anyways.  He denies any other acute concerns at this time.      Physical Exam  Triage Vital Signs: ED Triage Vitals  Enc Vitals Group     BP 01/13/22 0934 (!) 153/108     Pulse Rate 01/13/22 0934 75     Resp 01/13/22 0934 18     Temp 01/13/22 0934 98 F (36.7 C)     Temp src --      SpO2 01/13/22 0934 100 %     Weight 01/13/22 1013 198 lb 6.6 oz (90 kg)     Height 01/13/22 1013 5\' 8"  (1.727 m)     Head Circumference --      Peak Flow --      Pain Score 01/13/22 0934 5     Pain Loc --      Pain Edu? --      Excl. in GC? --     Most recent vital signs: Vitals:   01/13/22 0934  BP: (!) 153/108  Pulse: 75  Resp: 18  Temp: 98 F (36.7 C)  SpO2: 100%    General: Awake, no distress.  CV:  Good peripheral perfusion.  Resp:  Normal effort.  Abd:  No distention.  Other:  No CVA tenderness.   ED Results / Procedures / Treatments  Labs (all labs ordered are listed, but only abnormal results are displayed) Labs Reviewed   URINALYSIS, ROUTINE W REFLEX MICROSCOPIC - Abnormal; Notable for the following components:      Result Value   Color, Urine YELLOW (*)    APPearance HAZY (*)    Hgb urine dipstick LARGE (*)    Protein, ur 30 (*)    Leukocytes,Ua TRACE (*)    RBC / HPF >50 (*)    Bacteria, UA FEW (*)    All other components within normal limits  BASIC METABOLIC PANEL - Abnormal; Notable for the following components:   Calcium 8.7 (*)    All other components within normal limits  URINE CULTURE  CHLAMYDIA/NGC RT PCR ([facility] ONLY)            CBC     EKG   RADIOLOGY    PROCEDURES:  Critical Care performed: No  Procedures    MEDICATIONS ORDERED IN ED: Medications  cefTRIAXone (ROCEPHIN) injection 500 mg (500 mg Intramuscular Given 01/13/22 1055)  lidocaine (PF) (XYLOCAINE) 1 % injection 5 mL (5 mLs Other Given 01/13/22 1057)     IMPRESSION / MDM /  ASSESSMENT AND PLAN / ED COURSE  I reviewed the triage vital signs and the nursing notes.                              Differential diagnosis includes, but is not limited to urethritis from recently passed stone versus cystitis versus possible urethritis from STD.  He has no fevers or ongoing back pain or any CVA tenderness to suggest pyelonephritis or symptomatic stone at this time.  His BMP shows a normal kidney function and no significant electrolyte or metabolic derangements.  CBC shows no leukocytosis or acute anemia and I do not think he is septic.  UA is consistent with urinary infection with hemoglobin, 30 protein and trace leukocyte esterase with greater than 50 RBCs and 21-50 WBCs with bacteria noted as well.  We will send a urine culture and gonorrhea and Chlamydia studies.  Given he has no current pain fever or leukocytosis do not believe he requires CT imaging at this time.  We will treat empirically for gonorrhea and committee as well as simple cystitis.  Recommended close outpatient PCP follow-up and have blood pressure rechecked.   Discharge in stable condition.  Strict return precautions advised and discussed.      FINAL CLINICAL IMPRESSION(S) / ED DIAGNOSES   Final diagnoses:  Acute cystitis with hematuria  Hypertension, unspecified type     Rx / DC Orders   ED Discharge Orders          Ordered    doxycycline (VIBRAMYCIN) 100 MG capsule  2 times daily        01/13/22 1110    cephALEXin (KEFLEX) 500 MG capsule  4 times daily        01/13/22 1110             Note:  This document was prepared using Dragon voice recognition software and may include unintentional dictation errors.   Lucrezia Starch, MD 01/13/22 [phone]

## 2022-01-14 LAB — URINE CULTURE: Culture: NO GROWTH

## 2022-01-14 NOTE — Telephone Encounter (Signed)
Called patient again to inform of std results.  He was treated during visit.  Gave results and advised to inform partner/s so they can get treated as well.  Advised of free treatment available at [facility].

## 2022-01-22 ENCOUNTER — Emergency Department: Payer: Medicaid Other

## 2022-01-22 ENCOUNTER — Other Ambulatory Visit: Payer: Self-pay

## 2022-01-22 ENCOUNTER — Emergency Department
Admission: EM | Admit: 2022-01-22 | Discharge: 2022-01-22 | Disposition: A | Discharge status: Home / Self Care | Payer: Medicaid Other | Attending: Emergency Medicine | Admitting: Emergency Medicine

## 2022-01-22 ENCOUNTER — Encounter: Payer: Self-pay | Admitting: Emergency Medicine

## 2022-01-22 DIAGNOSIS — N200 Calculus of kidney: Secondary | ICD-10-CM | POA: Diagnosis not present

## 2022-01-22 DIAGNOSIS — I10 Essential (primary) hypertension: Secondary | ICD-10-CM | POA: Insufficient documentation

## 2022-01-22 DIAGNOSIS — R1030 Lower abdominal pain, unspecified: Secondary | ICD-10-CM | POA: Diagnosis present

## 2022-01-22 LAB — URINALYSIS, ROUTINE W REFLEX MICROSCOPIC
Bacteria, UA: NONE SEEN
Bilirubin Urine: NEGATIVE
Glucose, UA: NEGATIVE mg/dL
Ketones, ur: NEGATIVE mg/dL
Leukocytes,Ua: NEGATIVE
Nitrite: NEGATIVE
Protein, ur: NEGATIVE mg/dL
RBC / HPF: >50 RBC/hpf — ABNORMAL HIGH (ref 0–5)
Specific Gravity, Urine: 1.019 (ref 1.005–1.030)
pH: 5 (ref 5.0–8.0)

## 2022-01-22 LAB — CBC
HCT: 41.7 % (ref 39.0–52.0)
Hemoglobin: 13.9 g/dL (ref 13.0–17.0)
MCH: 28.7 pg (ref 26.0–34.0)
MCHC: 33.3 g/dL (ref 30.0–36.0)
MCV: 86.2 fL (ref 80.0–100.0)
Platelets: 230 10*3/uL (ref 150–400)
RBC: 4.84 MIL/uL (ref 4.22–5.81)
RDW: 12.1 % (ref 11.5–15.5)
WBC: 4.2 10*3/uL (ref 4.0–10.5)
nRBC: 0 % (ref 0.0–0.2)

## 2022-01-22 LAB — BASIC METABOLIC PANEL
Anion gap: 8 (ref 5–15)
BUN: 16 mg/dL (ref 6–20)
CO2: 26 mmol/L (ref 22–32)
Calcium: 8.6 mg/dL — ABNORMAL LOW (ref 8.9–10.3)
Chloride: 105 mmol/L (ref 98–111)
Creatinine, Ser: 1.25 mg/dL — ABNORMAL HIGH (ref 0.61–1.24)
GFR, Estimated: >60 mL/min (ref >60)
Glucose, Bld: 94 mg/dL (ref 70–99)
Potassium: 4.4 mmol/L (ref 3.5–5.1)
Sodium: 139 mmol/L (ref 135–145)

## 2022-01-22 LAB — CHLAMYDIA/NGC RT PCR (ARMC ONLY)
Chlamydia Tr: NOT DETECTED
N gonorrhoeae: NOT DETECTED

## 2022-01-22 MED ORDER — MORPHINE SULFATE (PF) 4 MG/ML IV SOLN
4.0000 mg | Freq: Once | INTRAVENOUS | Status: AC
  Administered 2022-01-22: 4 mg via INTRAVENOUS
  Filled 2022-01-22: qty 1

## 2022-01-22 MED ORDER — ONDANSETRON 4 MG PO TBDP: 4.0000 mg | ORAL_TABLET | Freq: Three times a day (TID) | ORAL | 0 refills | Status: AC | PRN

## 2022-01-22 MED ORDER — LACTATED RINGERS IV BOLUS
1000.0000 mL | Freq: Once | INTRAVENOUS | Status: AC
  Administered 2022-01-22: 1000 mL via INTRAVENOUS

## 2022-01-22 MED ORDER — TAMSULOSIN HCL 0.4 MG PO CAPS: 0.4000 mg | ORAL_CAPSULE | Freq: Every day | ORAL | 0 refills | Status: AC

## 2022-01-22 MED ORDER — OXYCODONE-ACETAMINOPHEN 5-325 MG PO TABS
1.0000 | ORAL_TABLET | Freq: Three times a day (TID) | ORAL | 0 refills | Status: AC | PRN
Start: 2022-01-22 — End: 2022-01-27

## 2022-01-22 MED ORDER — ONDANSETRON 4 MG PO TBDP: 4.0000 mg | ORAL_TABLET | Freq: Three times a day (TID) | ORAL | 0 refills | Status: DC | PRN

## 2022-01-22 MED ORDER — ONDANSETRON HCL 4 MG/2ML IJ SOLN
4.0000 mg | Freq: Once | INTRAMUSCULAR | Status: AC
  Administered 2022-01-22: 4 mg via INTRAVENOUS
  Filled 2022-01-22: qty 2

## 2022-01-22 MED ORDER — KETOROLAC TROMETHAMINE 30 MG/ML IJ SOLN
15.0000 mg | Freq: Once | INTRAMUSCULAR | Status: AC
  Administered 2022-01-22: 15 mg via INTRAVENOUS
  Filled 2022-01-22: qty 1

## 2022-01-22 MED ORDER — ACETAMINOPHEN 500 MG PO TABS
1000.0000 mg | ORAL_TABLET | Freq: Once | ORAL | Status: AC
  Administered 2022-01-22: 1000 mg via ORAL
  Filled 2022-01-22: qty 2

## 2022-01-22 MED ORDER — LACTATED RINGERS IV BOLUS: 1000.0000 mL | Freq: Once | INTRAVENOUS | Status: DC

## 2022-01-22 MED ORDER — MORPHINE SULFATE (PF) 4 MG/ML IV SOLN
6.0000 mg | Freq: Once | INTRAVENOUS | Status: AC
  Administered 2022-01-22: 6 mg via INTRAVENOUS
  Filled 2022-01-22: qty 2

## 2022-01-22 NOTE — ED Provider Notes (Signed)
[facility] Provider Note    Event Date/Time   First MD Initiated Contact with Patient 01/22/22 1432     (approximate)   History   Flank Pain (/)   HPI  Micheal Baxter is a 51 y.o. male with a past medical history of HTN and left-sided kidney stones most recent seen in the emergency room on 3/16 by this examiner for evaluation of some penile discharge treated for STD with GC studies showing gonorrhea who presents for evaluation of some recurrent leg pain.  Patient had some flank pain prior to coming into the emergency room on the 16th but this has resolved prior to arrival and imaging was deferred at that time.  He is coming to emergency room due to recurrence of the pain.  States it never really completely went away and seem to come back after he left the emergency room last time he was here.  States has had a couple days of nausea and vomiting and diarrhea as well.  No fevers, chest pain, cough, shortness of breath, right-sided symptoms including in abdomen or back or any new burning with urination or blood.  No other acute sick symptoms.      Physical Exam  Triage Vital Signs: ED Triage Vitals  Enc Vitals Group     BP 01/22/22 1415 (!) 148/99     Pulse Rate 01/22/22 1415 71     Resp 01/22/22 1415 18     Temp 01/22/22 1415 98.4 F (36.9 C)     Temp Source 01/22/22 1415 Oral     SpO2 01/22/22 1415 98 %     Weight 01/22/22 1414 220 lb (99.8 kg)     Height 01/22/22 1414 5\' 9"  (1.753 m)     Head Circumference --      Peak Flow --      Pain Score 01/22/22 1414 10     Pain Loc --      Pain Edu? --      Excl. in GC? --     Most recent vital signs: Vitals:   01/22/22 1630 01/22/22 1700  BP: 130/79 (!) 145/97  Pulse: 61 66  Resp:  16  Temp:    SpO2: 94% 96%    General: Awake, appears very uncomfortable. CV:  Good peripheral perfusion.  2+ radial pulse. Resp:  Normal effort.  Abd:  No distention.  Soft throughout. Other:  No significant CVA  tenderness.   ED Results / Procedures / Treatments  Labs (all labs ordered are listed, but only abnormal results are displayed) Labs Reviewed  URINALYSIS, ROUTINE W REFLEX MICROSCOPIC - Abnormal; Notable for the following components:      Result Value   Color, Urine YELLOW (*)    APPearance HAZY (*)    Hgb urine dipstick LARGE (*)    RBC / HPF >50 (*)    All other components within normal limits  BASIC METABOLIC PANEL - Abnormal; Notable for the following components:   Creatinine, Ser 1.25 (*)    Calcium 8.6 (*)    All other components within normal limits  CHLAMYDIA/NGC RT PCR ([facility] ONLY)            CBC     EKG    RADIOLOGY  CT abdomen pelvis, interpretation shows distal left ureteral stone at the utero vesicular junction causing some mild to moderate left hydronephrosis without any perinephric stranding.  There are some small nonobstructing stones in left kidney.  No diverticulitis, abscess,  or other acute abdominal or pelvic process.   PROCEDURES:  Critical Care performed: No  Procedures    MEDICATIONS ORDERED IN ED: Medications  morphine (PF) 4 MG/ML injection 6 mg (6 mg Intravenous Given 01/22/22 1503)  ondansetron (ZOFRAN) injection 4 mg (4 mg Intravenous Given 01/22/22 1502)  ketorolac (TORADOL) 30 MG/ML injection 15 mg (15 mg Intravenous Given 01/22/22 1503)  lactated ringers bolus 1,000 mL (0 mLs Intravenous Stopped 01/22/22 1751)  ondansetron (ZOFRAN) injection 4 mg (4 mg Intravenous Given 01/22/22 1618)  morphine (PF) 4 MG/ML injection 4 mg (4 mg Intravenous Given 01/22/22 1619)  acetaminophen (TYLENOL) tablet 1,000 mg (1,000 mg Oral Given 01/22/22 1754)     IMPRESSION / MDM / ASSESSMENT AND PLAN / ED COURSE  I reviewed the triage vital signs and the nursing notes.                              Differential diagnosis includes, but is not limited to kidney stone, diverticulitis, pyelonephritis, ongoing symptoms related to STD, appendicitis,  CT abdomen  pelvis, interpretation shows distal left ureteral stone at the utero vesicular junction causing some mild to moderate left hydronephrosis without any perinephric stranding.  There are some small nonobstructing stones in left kidney.  No diverticulitis, abscess, or other acute abdominal or pelvic process.  CBC without leukocytosis or acute anemia.  BMP without significant electrolyte or metabolic derangements other kidney function slightly worse compared to last time at 1.25 creatinine compared to 1.03 creatinine 9 days ago.  UA with some blood but no evidence of infection.  Patient feeling much better and able to tolerate p.o. on reassessment.  I considered observation follow-up given improvement in pain and patient now tolerating p.o. with low suspicion for infection and kidney function appropriate at this time I think he is safe for discharge with close outpatient neurology and PCP follow-up.  Discussed having blood pressure rechecked.  Discharged in stable condition.  Strict return precautions advised and discussed.     FINAL CLINICAL IMPRESSION(S) / ED DIAGNOSES   Final diagnoses:  Kidney stone     Rx / DC Orders   ED Discharge Orders          Ordered    oxyCODONE-acetaminophen (PERCOCET) 5-325 MG tablet  Every 8 hours PRN        01/22/22 1728    ondansetron (ZOFRAN-ODT) 4 MG disintegrating tablet  Every 8 hours PRN        01/22/22 1728    tamsulosin (FLOMAX) 0.4 MG CAPS capsule  Daily        01/22/22 1728             Note:  This document was prepared using Dragon voice recognition software and may include unintentional dictation errors.   Gilles Chiquito, MD 01/22/22 [phone]

## 2022-01-22 NOTE — ED Notes (Signed)
Delay in UA due to patient unable to urinate and refusing I/O cath.

## 2022-01-22 NOTE — Discharge Instructions (Addendum)
Please have your blood pressure rechecked when you follow-up with PCP as well.

## 2022-01-22 NOTE — ED Triage Notes (Signed)
Pt via POV from home. Pt c/o R flank pain. States he has a hx of kidney stone. Was seen on 2/16 for same but states the pain came back. Pt is A&OX4 and NAD

## 2022-01-23 ENCOUNTER — Other Ambulatory Visit: Payer: Self-pay

## 2022-01-23 ENCOUNTER — Emergency Department
Admission: EM | Admit: 2022-01-23 | Discharge: 2022-01-23 | Disposition: A | Discharge status: Home / Self Care | Payer: Medicaid Other | Attending: Emergency Medicine | Admitting: Emergency Medicine

## 2022-01-23 DIAGNOSIS — N202 Calculus of kidney with calculus of ureter: Secondary | ICD-10-CM | POA: Insufficient documentation

## 2022-01-23 DIAGNOSIS — R109 Unspecified abdominal pain: Secondary | ICD-10-CM | POA: Diagnosis present

## 2022-01-23 DIAGNOSIS — N200 Calculus of kidney: Secondary | ICD-10-CM

## 2022-01-23 LAB — CBC WITH DIFFERENTIAL/PLATELET
Abs Immature Granulocytes: 0.02 10*3/uL (ref 0.00–0.07)
Basophils Absolute: 0 10*3/uL (ref 0.0–0.1)
Basophils Relative: 0 %
Eosinophils Absolute: 0.1 10*3/uL (ref 0.0–0.5)
Eosinophils Relative: 2 %
HCT: 39 % (ref 39.0–52.0)
Hemoglobin: 13 g/dL (ref 13.0–17.0)
Immature Granulocytes: 0 %
Lymphocytes Relative: 15 %
Lymphs Abs: 1 10*3/uL (ref 0.7–4.0)
MCH: 28.8 pg (ref 26.0–34.0)
MCHC: 33.3 g/dL (ref 30.0–36.0)
MCV: 86.5 fL (ref 80.0–100.0)
Monocytes Absolute: 0.5 10*3/uL (ref 0.1–1.0)
Monocytes Relative: 7 %
Neutro Abs: 5.1 10*3/uL (ref 1.7–7.7)
Neutrophils Relative %: 76 %
Platelets: 221 10*3/uL (ref 150–400)
RBC: 4.51 MIL/uL (ref 4.22–5.81)
RDW: 12.1 % (ref 11.5–15.5)
WBC: 6.9 10*3/uL (ref 4.0–10.5)
nRBC: 0 % (ref 0.0–0.2)

## 2022-01-23 LAB — BASIC METABOLIC PANEL
Anion gap: 10 (ref 5–15)
BUN: 15 mg/dL (ref 6–20)
CO2: 29 mmol/L (ref 22–32)
Calcium: 8.7 mg/dL — ABNORMAL LOW (ref 8.9–10.3)
Chloride: 102 mmol/L (ref 98–111)
Creatinine, Ser: 1.43 mg/dL — ABNORMAL HIGH (ref 0.61–1.24)
GFR, Estimated: >60 mL/min (ref >60)
Glucose, Bld: 90 mg/dL (ref 70–99)
Potassium: 3.8 mmol/L (ref 3.5–5.1)
Sodium: 141 mmol/L (ref 135–145)

## 2022-01-23 MED ORDER — LACTATED RINGERS IV BOLUS
1000.0000 mL | Freq: Once | INTRAVENOUS | Status: AC
  Administered 2022-01-23: 1000 mL via INTRAVENOUS

## 2022-01-23 MED ORDER — TAMSULOSIN HCL 0.4 MG PO CAPS
0.4000 mg | ORAL_CAPSULE | Freq: Once | ORAL | Status: AC
  Administered 2022-01-23: 0.4 mg via ORAL
  Filled 2022-01-23: qty 1

## 2022-01-23 MED ORDER — KETOROLAC TROMETHAMINE 30 MG/ML IJ SOLN
15.0000 mg | Freq: Once | INTRAMUSCULAR | Status: AC
  Administered 2022-01-23: 15 mg via INTRAVENOUS
  Filled 2022-01-23: qty 1

## 2022-01-23 MED ORDER — SODIUM CHLORIDE 0.9 % IV BOLUS
1000.0000 mL | Freq: Once | INTRAVENOUS | Status: AC
  Administered 2022-01-23: 1000 mL via INTRAVENOUS

## 2022-01-23 MED ORDER — ONDANSETRON HCL 4 MG/2ML IJ SOLN
4.0000 mg | Freq: Once | INTRAMUSCULAR | Status: AC
  Administered 2022-01-23: 4 mg via INTRAVENOUS
  Filled 2022-01-23: qty 2

## 2022-01-23 MED ORDER — HYDROMORPHONE HCL 1 MG/ML IJ SOLN
1.0000 mg | Freq: Once | INTRAMUSCULAR | Status: AC
  Administered 2022-01-23: 1 mg via INTRAVENOUS
  Filled 2022-01-23: qty 1

## 2022-01-23 MED ORDER — MORPHINE SULFATE (PF) 4 MG/ML IV SOLN
4.0000 mg | Freq: Once | INTRAVENOUS | Status: AC
  Administered 2022-01-23: 4 mg via INTRAVENOUS
  Filled 2022-01-23: qty 1

## 2022-01-23 MED ORDER — OXYCODONE HCL 5 MG PO TABS: 5.0000 mg | ORAL_TABLET | Freq: Four times a day (QID) | ORAL | 0 refills | Status: DC | PRN

## 2022-01-23 NOTE — ED Notes (Signed)
Pt presents to ED with c/o of being seen yesterday for L sided kidney stones, pt was d/c'ed home with pain meds, pt states he took one percocet today with no relief. Pt denies any issues with urinating at this time. Pt is in a fetal like position int he bed due to pain.

## 2022-01-23 NOTE — Discharge Instructions (Addendum)
Take 2 Percocets every 6 hours for pain, rather than 1. If you take 1 percocet, you can take an Oxycodone in addition to this.  Take Ibuprofen 600 mg every 8 hours.  Take the Flomax as prescribed.  Call Urology at the number above to set up appointment to discuss lithotripsy or other treatment options.

## 2022-01-23 NOTE — ED Triage Notes (Signed)
Pt via POV from home. Pt was seen yesterday for kidney stone. CT, lab, and urine were done. Pt states he cannot take the pain anymore but has not tried any of the medication prescribed. Pt is A&OX4 and NAD>

## 2022-01-23 NOTE — ED Provider Notes (Signed)
[facility] Provider Note    Event Date/Time   First MD Initiated Contact with Patient 01/23/22 1240     (approximate)   History   Flank Pain   HPI  Micheal Baxter is a 51 y.o. male with past medical history of recurrent kidney stones here with severe, ongoing, flank pain.  The patient was just seen in the ED for similar symptoms and discharged home.  He states that he could not get his medications last night and was in pain throughout the night.  He got his medications and took 1 Percocet, but was not improved so he presents for further evaluation.  He is requesting that we give him something to "push" the kidney stone out.  Reports he has passed all of his previous stones.  He has never had to have a stent placed.  Denies any fevers or chills.  He has been mildly nauseous but not vomiting.  Denies any other changes in his health.     Physical Exam   Triage Vital Signs: ED Triage Vitals  Enc Vitals Group     BP 01/23/22 1139 (!) 174/111     Pulse Rate 01/23/22 1139 74     Resp 01/23/22 1139 18     Temp 01/23/22 1139 98.5 F (36.9 C)     Temp Source 01/23/22 1139 Oral     SpO2 01/23/22 1139 98 %     Weight --      Height --      Head Circumference --      Peak Flow --      Pain Score 01/23/22 1138 10     Pain Loc --      Pain Edu? --      Excl. in GC? --     Most recent vital signs: Vitals:   01/23/22 1139  BP: (!) 174/111  Pulse: 74  Resp: 18  Temp: 98.5 F (36.9 C)  SpO2: 98%     General: Awake, no distress.  CV:  Good peripheral perfusion.  Resp:  Normal effort.  Abd:  No distention.  Mild left lower quadrant tenderness.  No guarding. Other:  Moist mucous membranes.   ED Results / Procedures / Treatments   Labs (all labs ordered are listed, but only abnormal results are displayed) Labs Reviewed  BASIC METABOLIC PANEL - Abnormal; Notable for the following components:      Result Value   Creatinine, Ser 1.43 (*)     Calcium 8.7 (*)    All other components within normal limits  CBC WITH DIFFERENTIAL/PLATELET     EKG    RADIOLOGY CT stone: 1 to 2 mm stone at the left ureterovesical junction   I also independently reviewed and agree wit radiologist interpretations.   PROCEDURES:  Critical Care performed: No   MEDICATIONS ORDERED IN ED: Medications  HYDROmorphone (DILAUDID) injection 1 mg (1 mg Intravenous Given 01/23/22 1325)  ketorolac (TORADOL) 30 MG/ML injection 15 mg (15 mg Intravenous Given 01/23/22 1324)  lactated ringers bolus 1,000 mL (0 mLs Intravenous Stopping Infusion hung by another clincian 01/23/22 1530)  tamsulosin (FLOMAX) capsule 0.4 mg (0.4 mg Oral Given 01/23/22 1325)  sodium chloride 0.9 % bolus 1,000 mL (0 mLs Intravenous Stopping Infusion hung by another clincian 01/23/22 1530)  ondansetron (ZOFRAN) injection 4 mg (4 mg Intravenous Given 01/23/22 1449)  morphine (PF) 4 MG/ML injection 4 mg (4 mg Intravenous Given 01/23/22 1449)     IMPRESSION / MDM /  ASSESSMENT AND PLAN / ED COURSE  I reviewed the triage vital signs and the nursing notes.               MDM:  51 yo M here with ongoing pain 2/2 known 1-2 mm obstructing stone on left. Pt had only taken 1 dose of analgesics prior to arriving in ED. I reviewed labs, imaging from recent visit. No apparent acute complications. He is afebrile. Mild increase in Cr likely from poor PO intake and he was given 2L [place]. Stone is 1-2 mm and at UVJ, suspect high likelihood of passing. Pain resolved in ED with analgesia. No leukocytosis or signs of infection. UA negative for infectious signs yesterday. He is hypertensive but this is baseline, and clinically his sx are not concerning for dissection, AAA, and no signs of secondary changes on CT just yesterday.   I counselled pt on expected course of passing stone, and encouraged him to take regular percocet and flomax to help prevent severe breakthrough. Will have him take oxycodone 10 mg  (percocet 5 + oxy 5) as given his weight this is likely more effective, and encourage hydration. Return precautions given.    MEDICATIONS GIVEN IN ED: Medications  HYDROmorphone (DILAUDID) injection 1 mg (1 mg Intravenous Given 01/23/22 1325)  ketorolac (TORADOL) 30 MG/ML injection 15 mg (15 mg Intravenous Given 01/23/22 1324)  lactated ringers bolus 1,000 mL (0 mLs Intravenous Stopping Infusion hung by another clincian 01/23/22 1530)  tamsulosin (FLOMAX) capsule 0.4 mg (0.4 mg Oral Given 01/23/22 1325)  sodium chloride 0.9 % bolus 1,000 mL (0 mLs Intravenous Stopping Infusion hung by another clincian 01/23/22 1530)  ondansetron (ZOFRAN) injection 4 mg (4 mg Intravenous Given 01/23/22 1449)  morphine (PF) 4 MG/ML injection 4 mg (4 mg Intravenous Given 01/23/22 1449)     Consults:  None   EMR reviewed  ED visit yesterday, with CT scan   FINAL CLINICAL IMPRESSION(S) / ED DIAGNOSES   Final diagnoses:  Nephrolithiasis  Kidney stone     Rx / DC Orders   ED Discharge Orders          Ordered    oxyCODONE (ROXICODONE) 5 MG immediate release tablet  Every 6 hours PRN        01/23/22 1457             Note:  This document was prepared using Dragon voice recognition software and may include unintentional dictation errors.   Shaune Pollack, MD 01/23/22 [phone]

## 2022-03-15 ENCOUNTER — Emergency Department: Payer: Medicaid Other

## 2022-03-15 ENCOUNTER — Other Ambulatory Visit: Payer: Self-pay

## 2022-03-15 ENCOUNTER — Emergency Department
Admission: EM | Admit: 2022-03-15 | Discharge: 2022-03-15 | Disposition: A | Discharge status: Home / Self Care | Payer: Medicaid Other | Attending: Emergency Medicine | Admitting: Emergency Medicine

## 2022-03-15 DIAGNOSIS — N200 Calculus of kidney: Secondary | ICD-10-CM

## 2022-03-15 DIAGNOSIS — I10 Essential (primary) hypertension: Secondary | ICD-10-CM | POA: Diagnosis not present

## 2022-03-15 DIAGNOSIS — N132 Hydronephrosis with renal and ureteral calculous obstruction: Secondary | ICD-10-CM | POA: Insufficient documentation

## 2022-03-15 DIAGNOSIS — R109 Unspecified abdominal pain: Secondary | ICD-10-CM | POA: Diagnosis present

## 2022-03-15 LAB — COMPREHENSIVE METABOLIC PANEL
ALT: 14 U/L (ref 0–44)
AST: 22 U/L (ref 15–41)
Albumin: 4.5 g/dL (ref 3.5–5.0)
Alkaline Phosphatase: 70 U/L (ref 38–126)
Anion gap: 7 (ref 5–15)
BUN: 16 mg/dL (ref 6–20)
CO2: 29 mmol/L (ref 22–32)
Calcium: 9.1 mg/dL (ref 8.9–10.3)
Chloride: 103 mmol/L (ref 98–111)
Creatinine, Ser: 1.2 mg/dL (ref 0.61–1.24)
GFR, Estimated: >60 mL/min (ref >60)
Glucose, Bld: 110 mg/dL — ABNORMAL HIGH (ref 70–99)
Potassium: 4.6 mmol/L (ref 3.5–5.1)
Sodium: 139 mmol/L (ref 135–145)
Total Bilirubin: 0.7 mg/dL (ref 0.3–1.2)
Total Protein: 8.8 g/dL — ABNORMAL HIGH (ref 6.5–8.1)

## 2022-03-15 LAB — URINALYSIS, ROUTINE W REFLEX MICROSCOPIC
Bacteria, UA: NONE SEEN
Bilirubin Urine: NEGATIVE
Glucose, UA: NEGATIVE mg/dL
Ketones, ur: NEGATIVE mg/dL
Leukocytes,Ua: NEGATIVE
Nitrite: NEGATIVE
Protein, ur: NEGATIVE mg/dL
Specific Gravity, Urine: 1.015 (ref 1.005–1.030)
pH: 6 (ref 5.0–8.0)

## 2022-03-15 LAB — CBC
HCT: 47.6 % (ref 39.0–52.0)
Hemoglobin: 15.8 g/dL (ref 13.0–17.0)
MCH: 28.3 pg (ref 26.0–34.0)
MCHC: 33.2 g/dL (ref 30.0–36.0)
MCV: 85.3 fL (ref 80.0–100.0)
Platelets: 262 10*3/uL (ref 150–400)
RBC: 5.58 MIL/uL (ref 4.22–5.81)
RDW: 12.3 % (ref 11.5–15.5)
WBC: 4.9 10*3/uL (ref 4.0–10.5)
nRBC: 0 % (ref 0.0–0.2)

## 2022-03-15 MED ORDER — KETOROLAC TROMETHAMINE 30 MG/ML IJ SOLN
30.0000 mg | Freq: Once | INTRAMUSCULAR | Status: AC
Start: 2022-03-15 — End: 2022-03-15
  Administered 2022-03-15: 30 mg via INTRAVENOUS
  Filled 2022-03-15: qty 1

## 2022-03-15 MED ORDER — OXYCODONE-ACETAMINOPHEN 5-325 MG PO TABS
1.0000 | ORAL_TABLET | Freq: Once | ORAL | Status: AC
  Administered 2022-03-15: 1 via ORAL
  Filled 2022-03-15: qty 1

## 2022-03-15 MED ORDER — OXYCODONE-ACETAMINOPHEN 5-325 MG PO TABS: 1.0000 | ORAL_TABLET | ORAL | 0 refills | Status: DC | PRN

## 2022-03-15 MED ORDER — TAMSULOSIN HCL 0.4 MG PO CAPS: 0.4000 mg | ORAL_CAPSULE | Freq: Every day | ORAL | 0 refills | Status: DC

## 2022-03-15 MED ORDER — ONDANSETRON 4 MG PO TBDP
4.0000 mg | ORAL_TABLET | Freq: Once | ORAL | Status: AC
  Administered 2022-03-15: 4 mg via ORAL
  Filled 2022-03-15: qty 1

## 2022-03-15 MED ORDER — ONDANSETRON 4 MG PO TBDP: 4.0000 mg | ORAL_TABLET | Freq: Three times a day (TID) | ORAL | 0 refills | Status: AC | PRN

## 2022-03-15 MED ORDER — ONDANSETRON HCL 4 MG/2ML IJ SOLN
4.0000 mg | Freq: Once | INTRAMUSCULAR | Status: AC
  Administered 2022-03-15: 4 mg via INTRAVENOUS
  Filled 2022-03-15: qty 2

## 2022-03-15 MED ORDER — SODIUM CHLORIDE 0.9 % IV BOLUS
1000.0000 mL | Freq: Once | INTRAVENOUS | Status: AC
Start: 2022-03-15 — End: 2022-03-15
  Administered 2022-03-15: 1000 mL via INTRAVENOUS

## 2022-03-15 NOTE — ED Provider Triage Note (Signed)
Emergency Medicine Provider Triage Evaluation Note ? ?Micheal Baxter , a 51 y.o. male  was evaluated in triage.  Pt complains of flank pain, history of kidney stones.  Vomiting.  States feels same as previous kidney stones. ? ?Review of Systems  ?Positive:  ?Negative:  ? ?Physical Exam  ?There were no vitals taken for this visit. ?Gen:   Awake, no distress   ?Resp:  Normal effort  ?MSK:   Moves extremities without difficulty  ?Other:   ? ?Medical Decision Making  ?Medically screening exam initiated at 1:58 PM.  Appropriate orders placed.  Micheal Baxter was informed that the remainder of the evaluation will be completed by another provider, this initial triage assessment does not replace that evaluation, and the importance of remaining in the ED until their evaluation is complete. ? ?Positive CVA tenderness on the left.  Ordered CT renal stone study.  Medication. ?  ?Versie Starks, PA-C ?03/15/22 1359 ? ?

## 2022-03-15 NOTE — ED Provider Notes (Signed)
? ?[facility] ?Provider Note ? ? ? Event Date/Time  ? First MD Initiated Contact with Patient 03/15/22 1506   ?  (approximate) ? ?History  ? ?Chief Complaint: Flank Pain ? ?HPI ? ?Micheal Baxter is a 51 y.o. male with a past medical history of hypertension, presents to the emergency department for left flank pain.  According to the patient approximately 2 hours prior to arrival he developed acute severe pain in the left flank consistent with prior kidney stones.  Patient denies any dysuria or hematuria.  Denies any fever.  Does state nausea currently states 8/10 sharp pain in the left flank. ? ?Physical Exam  ? ?Triage Vital Signs: ?ED Triage Vitals [03/15/22 1359]  ?Enc Vitals Group  ?   BP (!) 161/120  ?   Pulse Rate 66  ?   Resp 20  ?   Temp 97.9 ?F (36.6 ?C)  ?   Temp Source Oral  ?   SpO2 99 %  ?   Weight   ?   Height   ?   Head Circumference   ?   Peak Flow   ?   Pain Score   ?   Pain Loc   ?   Pain Edu?   ?   Excl. in GC?   ? ? ?Most recent vital signs: ?Vitals:  ? 03/15/22 1359  ?BP: (!) 161/120  ?Pulse: 66  ?Resp: 20  ?Temp: 97.9 ?F (36.6 ?C)  ?SpO2: 99%  ? ? ?General: Awake, no distress.  ?CV:  Good peripheral perfusion.  Regular rate and rhythm  ?Resp:  Normal effort.  Equal breath sounds bilaterally. ?Abd:  No distention.  Soft, nontender.  No rebound or guarding.  Mild left CVA tenderness to palpation ? ? ?ED Results / Procedures / Treatments  ? ? ?RADIOLOGY ? ?I personally reviewed the CT scan, patient appears to have a left-sided ureteral stone. ?Radiology is read the CT is a 3 mm mid left ureteral calculus with moderate hydroureteronephrosis. ? ? ?MEDICATIONS ORDERED IN ED: ?Medications  ?sodium chloride 0.9 % bolus 1,000 mL (has no administration in time range)  ?ketorolac (TORADOL) 30 MG/ML injection 30 mg (has no administration in time range)  ?ondansetron (ZOFRAN) injection 4 mg (has no administration in time range)  ?oxyCODONE-acetaminophen (PERCOCET/ROXICET) 5-325 MG  per tablet 1 tablet (1 tablet Oral Given 03/15/22 1401)  ?ondansetron (ZOFRAN-ODT) disintegrating tablet 4 mg (4 mg Oral Given 03/15/22 1404)  ? ? ? ?IMPRESSION / MDM / ASSESSMENT AND PLAN / ED COURSE  ?I reviewed the triage vital signs and the nursing notes. ? ?Patient presents to the emergency department for left flank pain.  Patient has a history of kidney stones which is feels identical.  Pain started 2 hours prior to arrival.  Overall the patient appears well does appear uncomfortable currently lying on his side.  Patient's lab work shows a normal chemistry.  Normal CBC with normal white blood cell count.  CT scan confirms 3 mm mid left ureteral stone with hydroureteronephrosis.  We will treat with Toradol Zofran IV fluids and reassess.  Urinalysis pending. ? ?Urinalysis shows red blood cells but no white cells or signs of bacteria.  Patient states his pain is now gone.  We will discharge with Percocet, Flomax, Zofran have the patient follow-up with urology and discharged with a urine strainer.  Discussed my typical kidney stone return precautions.  Patient agreeable to plan. ? ?FINAL CLINICAL IMPRESSION(S) / ED DIAGNOSES  ? ?  Ureterolithiasis ? ? ?Note:  This document was prepared using Dragon voice recognition software and may include unintentional dictation errors. ?  ?Minna Antis, MD ?03/15/22 1752 ? ?

## 2022-03-15 NOTE — ED Notes (Signed)
Attempting to get BP reading still. ?

## 2022-03-15 NOTE — ED Triage Notes (Signed)
Pt c/o left flank pain that started in the last hour, states he has a hx of kidney stones and this feels the same, denies any N/V at this time ?

## 2022-03-17 LAB — URINE CULTURE: Culture: NO GROWTH

## 2022-03-18 ENCOUNTER — Other Ambulatory Visit: Payer: Self-pay

## 2022-03-18 ENCOUNTER — Emergency Department
Admission: EM | Admit: 2022-03-18 | Discharge: 2022-03-18 | Disposition: A | Discharge status: Home / Self Care | Payer: Medicaid Other | Attending: Emergency Medicine | Admitting: Emergency Medicine

## 2022-03-18 DIAGNOSIS — R1032 Left lower quadrant pain: Secondary | ICD-10-CM | POA: Diagnosis present

## 2022-03-18 DIAGNOSIS — I10 Essential (primary) hypertension: Secondary | ICD-10-CM | POA: Diagnosis not present

## 2022-03-18 DIAGNOSIS — N200 Calculus of kidney: Secondary | ICD-10-CM | POA: Diagnosis not present

## 2022-03-18 LAB — CBC
HCT: 42.6 % (ref 39.0–52.0)
Hemoglobin: 14 g/dL (ref 13.0–17.0)
MCH: 28.2 pg (ref 26.0–34.0)
MCHC: 32.9 g/dL (ref 30.0–36.0)
MCV: 85.9 fL (ref 80.0–100.0)
Platelets: 196 10*3/uL (ref 150–400)
RBC: 4.96 MIL/uL (ref 4.22–5.81)
RDW: 12.2 % (ref 11.5–15.5)
WBC: 13.3 10*3/uL — ABNORMAL HIGH (ref 4.0–10.5)
nRBC: 0 % (ref 0.0–0.2)

## 2022-03-18 LAB — BASIC METABOLIC PANEL
Anion gap: 10 (ref 5–15)
BUN: 20 mg/dL (ref 6–20)
CO2: 26 mmol/L (ref 22–32)
Calcium: 8.5 mg/dL — ABNORMAL LOW (ref 8.9–10.3)
Chloride: 102 mmol/L (ref 98–111)
Creatinine, Ser: 1.34 mg/dL — ABNORMAL HIGH (ref 0.61–1.24)
GFR, Estimated: >60 mL/min (ref >60)
Glucose, Bld: 81 mg/dL (ref 70–99)
Potassium: 3.7 mmol/L (ref 3.5–5.1)
Sodium: 138 mmol/L (ref 135–145)

## 2022-03-18 LAB — URINALYSIS, ROUTINE W REFLEX MICROSCOPIC
Bacteria, UA: NONE SEEN
Bilirubin Urine: NEGATIVE
Glucose, UA: NEGATIVE mg/dL
Ketones, ur: 20 mg/dL — AB
Leukocytes,Ua: NEGATIVE
Nitrite: NEGATIVE
Protein, ur: 100 mg/dL — AB
Specific Gravity, Urine: 1.028 (ref 1.005–1.030)
pH: 5 (ref 5.0–8.0)

## 2022-03-18 LAB — LACTIC ACID, PLASMA: Lactic Acid, Venous: 1.5 mmol/L (ref 0.5–1.9)

## 2022-03-18 MED ORDER — KETOROLAC TROMETHAMINE 30 MG/ML IJ SOLN
30.0000 mg | Freq: Once | INTRAMUSCULAR | Status: AC
  Administered 2022-03-18: 30 mg via INTRAVENOUS
  Filled 2022-03-18: qty 1

## 2022-03-18 MED ORDER — HYDROMORPHONE HCL 1 MG/ML IJ SOLN
1.0000 mg | Freq: Once | INTRAMUSCULAR | Status: AC
  Administered 2022-03-18: 1 mg via INTRAVENOUS
  Filled 2022-03-18: qty 1

## 2022-03-18 MED ORDER — SODIUM CHLORIDE 0.9 % IV BOLUS
1000.0000 mL | Freq: Once | INTRAVENOUS | Status: AC
  Administered 2022-03-18: 1000 mL via INTRAVENOUS

## 2022-03-18 MED ORDER — OXYCODONE HCL 5 MG PO TABS
5.0000 mg | ORAL_TABLET | Freq: Four times a day (QID) | ORAL | 0 refills | Status: DC | PRN
Start: 2022-03-18 — End: 2023-03-15

## 2022-03-18 MED ORDER — HYDROMORPHONE HCL 1 MG/ML IJ SOLN
1.0000 mg | Freq: Once | INTRAMUSCULAR | Status: DC
  Filled 2022-03-18: qty 1

## 2022-03-18 MED ORDER — ONDANSETRON HCL 4 MG/2ML IJ SOLN
4.0000 mg | Freq: Once | INTRAMUSCULAR | Status: AC
  Administered 2022-03-18: 4 mg via INTRAVENOUS
  Filled 2022-03-18: qty 2

## 2022-03-18 MED ORDER — HYDROMORPHONE HCL 1 MG/ML IJ SOLN
0.5000 mg | Freq: Once | INTRAMUSCULAR | Status: AC
  Administered 2022-03-18: 0.5 mg via INTRAVENOUS

## 2022-03-18 NOTE — ED Notes (Signed)
Lactic sent on ice to lab.  ?

## 2022-03-18 NOTE — ED Notes (Signed)
Max noted on bladder scan 35cc. Pt's abdomen tight like muscles aren't relaxed; pt reports he is not flexing abdominal muscles; abdomen is not distended currently.  ?

## 2022-03-18 NOTE — ED Triage Notes (Signed)
Patient to ER via POV with complaints of left sided flank pain, reports being diagnosed with a kidney stone on 4/18. Reports the pain feels the same, some burning with urination. Unsure if he has passed some of the stone already.  ?

## 2022-03-18 NOTE — ED Notes (Signed)
50 yom with a c/c of left sided flank pain that is radiating to around to his abdomen. Pt was seen ref the same two days ago.  ?

## 2022-03-18 NOTE — ED Provider Notes (Signed)
?[facility] EMERGENCY DEPARTMENT ?Provider Note ? ? ?CSN: [id number] ?Arrival date & time: 03/18/22  1535 ? ?  ? ?History ? ?Chief Complaint  ?Patient presents with  ? Flank Pain  ? ? ?Micheal Baxter is a 51 y.o. male with history of hypertension and recent left kidney stone.  Patient was seen in the ER on 03/15/2022 and was found to have a 3 mm left ureteral calculus with mild to moderate hydro ureteronephrosis.  There is also an additional small nonobstructing left renal calculi.  Patient was given pain medications and discharged home, states he was doing well up until this morning, developed reoccurring left flank pain.  He also notes some slight discomfort with urination.  Has had normal urinary output.  Denies any fevers.  Pain is located along the left flank and left lower abdomen.  He describes sharp pain similar to the pain he had 3 days ago.  He was prescribed oxycodone but states he has not been taking this due to taking care of his kids. ? ?HPI ? ?  ? ?Home Medications ?Prior to Admission medications   ?Medication Sig Start Date End Date Taking? Authorizing Provider  ?ondansetron (ZOFRAN-ODT) 4 MG disintegrating tablet Take 1 tablet (4 mg total) by mouth every 8 (eight) hours as needed for nausea or vomiting. 03/15/22   Minna Antis, MD  ?oxyCODONE (ROXICODONE) 5 MG immediate release tablet Take 1 tablet (5 mg total) by mouth every 6 (six) hours as needed for severe pain (Take in addition to the 1 percocet for breakthrough pain.). 03/18/22 03/18/23  Evon Slack, PA-C  ?oxyCODONE-acetaminophen (PERCOCET) 5-325 MG tablet Take 1 tablet by mouth every 4 (four) hours as needed for severe pain. 03/15/22   Minna Antis, MD  ?tamsulosin (FLOMAX) 0.4 MG CAPS capsule Take 1 capsule (0.4 mg total) by mouth daily. 03/15/22   Minna Antis, MD  ?   ? ?Allergies    ?No known allergies   ? ?Review of Systems   ?Review of Systems ? ?Physical Exam ?Updated Vital Signs ?BP (!) 146/98    Pulse 82   Temp 98 ?F (36.7 ?C) (Oral)   Resp 18   Ht 5\' 9"  (1.753 m)   SpO2 100%   BMI 32.49 kg/m?  ?Physical Exam ?Constitutional:   ?   Appearance: He is well-developed.  ?HENT:  ?   Head: Normocephalic and atraumatic.  ?   Nose: Nose normal.  ?   Mouth/Throat:  ?   Mouth: Mucous membranes are moist.  ?   Pharynx: No oropharyngeal exudate or posterior oropharyngeal erythema.  ?Eyes:  ?   Conjunctiva/sclera: Conjunctivae normal.  ?Cardiovascular:  ?   Rate and Rhythm: Normal rate.  ?Pulmonary:  ?   Effort: Pulmonary effort is normal. No respiratory distress.  ?Abdominal:  ?   General: There is no distension.  ?   Tenderness: There is no abdominal tenderness. There is left CVA tenderness. There is no guarding.  ?Musculoskeletal:     ?   General: Normal range of motion.  ?   Cervical back: Normal range of motion.  ?Skin: ?   General: Skin is warm.  ?   Findings: No rash.  ?Neurological:  ?   General: No focal deficit present.  ?   Mental Status: He is alert and oriented to person, place, and time.  ?Psychiatric:     ?   Behavior: Behavior normal.     ?   Thought Content: Thought content normal.  ? ? ?  ED Results / Procedures / Treatments   ?Labs ?(all labs ordered are listed, but only abnormal results are displayed) ?Labs Reviewed  ?URINALYSIS, ROUTINE W REFLEX MICROSCOPIC - Abnormal; Notable for the following components:  ?    Result Value  ? Color, Urine YELLOW (*)   ? APPearance CLEAR (*)   ? Hgb urine dipstick MODERATE (*)   ? Ketones, ur 20 (*)   ? Protein, ur 100 (*)   ? All other components within normal limits  ?CBC - Abnormal; Notable for the following components:  ? WBC 13.3 (*)   ? All other components within normal limits  ?BASIC METABOLIC PANEL - Abnormal; Notable for the following components:  ? Creatinine, Ser 1.34 (*)   ? Calcium 8.5 (*)   ? All other components within normal limits  ?URINE CULTURE  ?LACTIC ACID, PLASMA  ? ? ?EKG ?None ? ?Radiology ?No results found. ? ?Procedures ?Procedures   ? ? ?Medications Ordered in ED ?Medications  ?HYDROmorphone (DILAUDID) injection 1 mg (1 mg Intravenous Given 03/18/22 1656)  ?sodium chloride 0.9 % bolus 1,000 mL (1,000 mLs Intravenous New Bag/Given 03/18/22 1658)  ?ondansetron (ZOFRAN) injection 4 mg (4 mg Intravenous Given 03/18/22 1657)  ?ketorolac (TORADOL) 30 MG/ML injection 30 mg (30 mg Intravenous Given 03/18/22 1729)  ?HYDROmorphone (DILAUDID) injection 0.5 mg (0.5 mg Intravenous Given 03/18/22 1811)  ? ? ?ED Course/ Medical Decision Making/ A&P ?  ?                        ?Medical Decision Making ?Amount and/or Complexity of Data Reviewed ?Labs: ordered. ? ?Risk ?Prescription drug management. ? ? ?51 year old male with left ureteral stone, 3 mm the mid ureter.  He was diagnosed with a stone 3 days ago, was doing well until this morning when he noticed increased pain slightly lower down and the left flank and lower pelvic area.  He denies any fevers, vital signs are stable with no tachycardia or elevated temp today.  White count slightly elevated.  He has no signs of infection in the urine.  Urine culture is obtained.  Patient's pain resolved with Toradol, fluids and Dilaudid.  Understands signs and symptoms return to the ER for.  He will follow-up with urology ?Final Clinical Impression(s) / ED Diagnoses ?Final diagnoses:  ?Kidney stone  ? ? ?Rx / DC Orders ?ED Discharge Orders   ? ?      Ordered  ?  oxyCODONE (ROXICODONE) 5 MG immediate release tablet  Every 6 hours PRN       ? 03/18/22 1955  ? ?  ?  ? ?  ? ? ?  ?Evon Slack, PA-C ?03/18/22 1957 ? ?  ?Merwyn Katos, MD ?03/19/22 1151 ? ?

## 2022-03-18 NOTE — Discharge Instructions (Addendum)
Please continue with Flomax and oxycodone as needed for pain.  Make sure you are drinking lots of fluids.  Return to the ER immediately for any fevers increasing pain worsening symptoms or to changes in your health ?

## 2022-03-21 LAB — URINE CULTURE: Culture: NO GROWTH

## 2022-06-26 IMAGING — CT CT RENAL STONE PROTOCOL
2 of 4 series · 16 of 46 positions shown, 18 images · non-contrast
Comparison: None.

CLINICAL DATA: Left flank pain.



[Series 2: stone full standard · axial · 0.71mm/px · z∈[-1154,-684]mm · 13 of 104 slices shown, 15 images]
[im 5/104  soft-tissue]
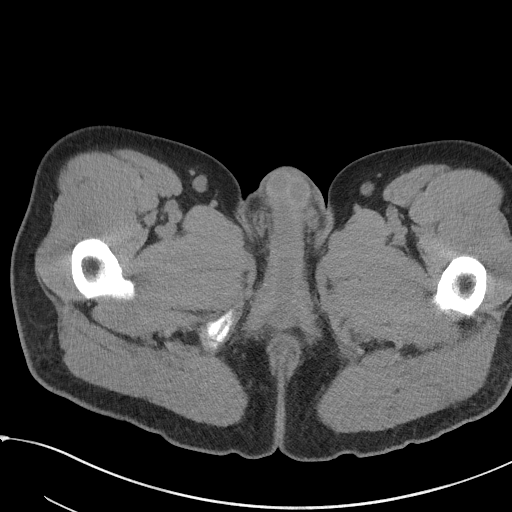
[im 5/104  bone]
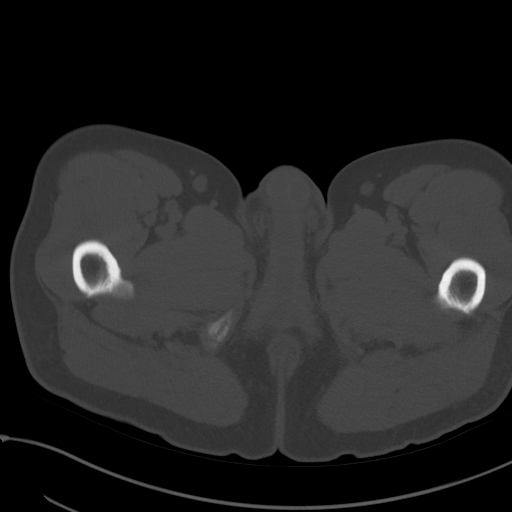
[im 13/104  soft-tissue]
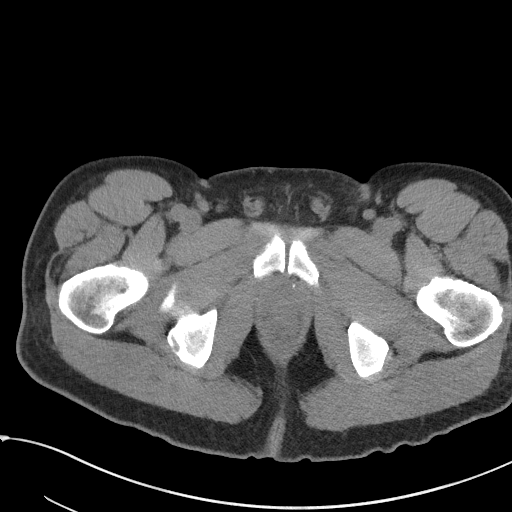
[im 21/104  soft-tissue]
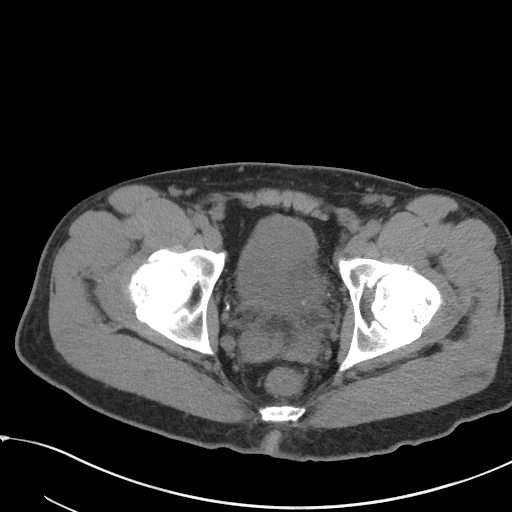
[im 29/104  soft-tissue]
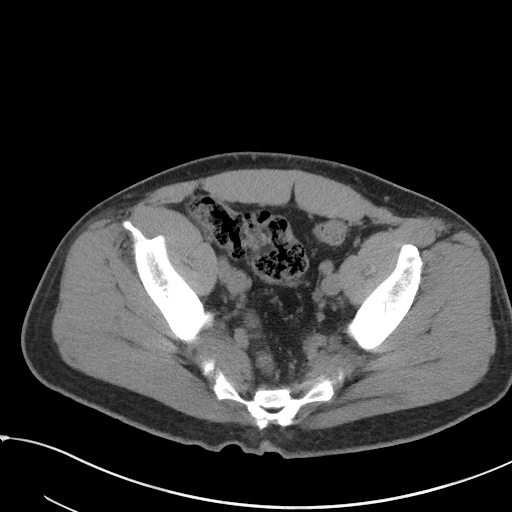
[im 38/104  soft-tissue]
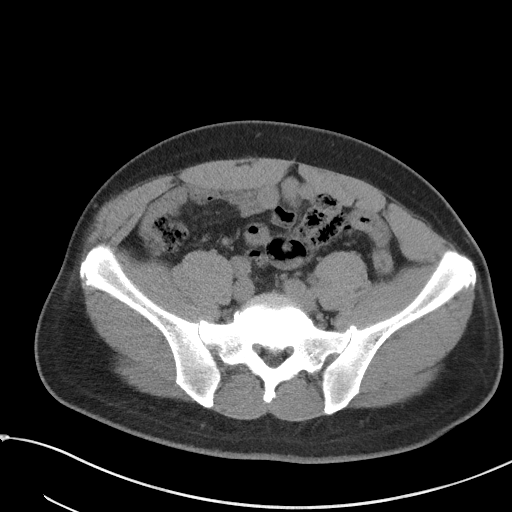
[im 46/104  soft-tissue]
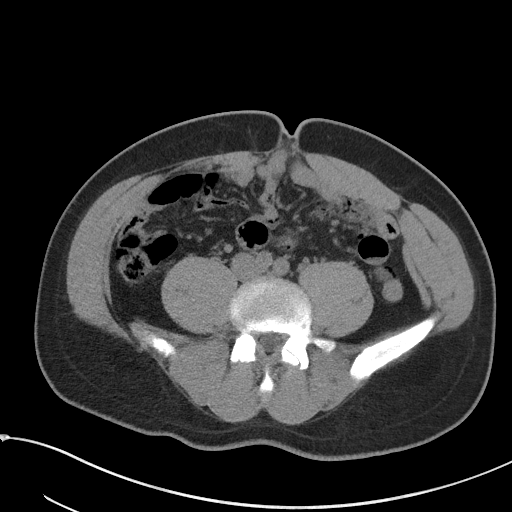
[im 54/104  soft-tissue]
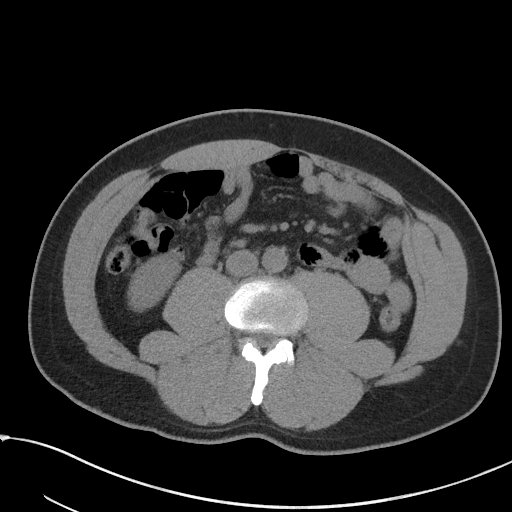
[im 58/104  soft-tissue]
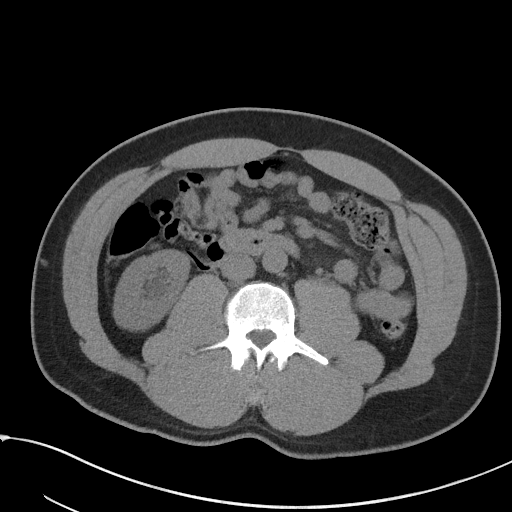
[im 66/104  soft-tissue]
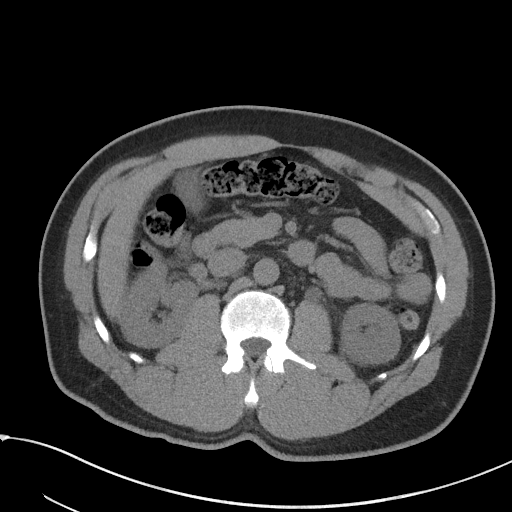
[im 66/104  bone]
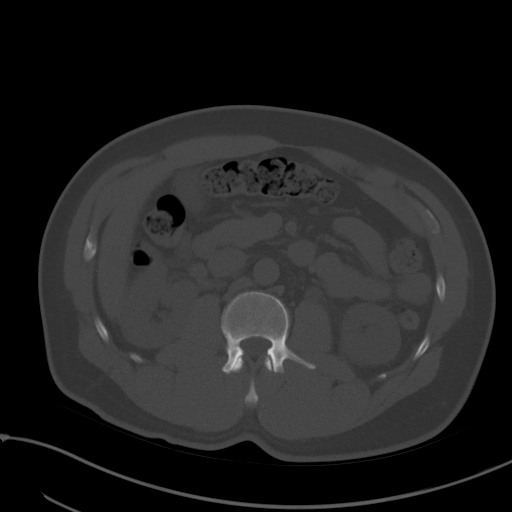
[im 75/104  soft-tissue]
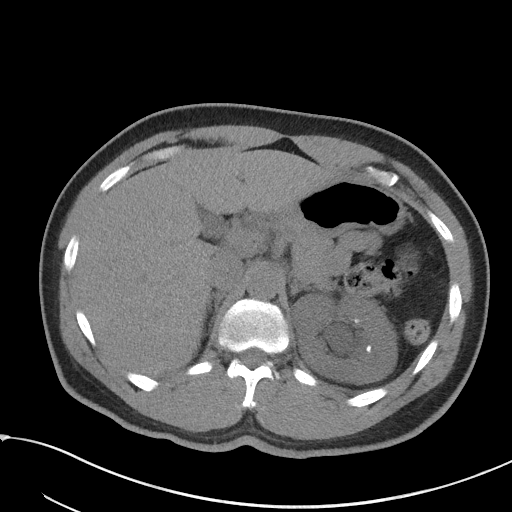
[im 83/104  soft-tissue]
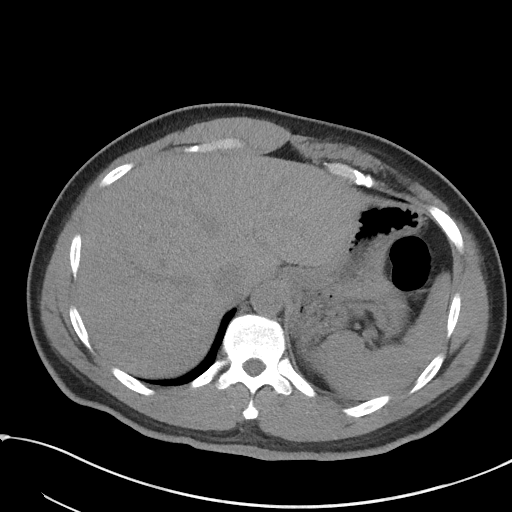
[im 91/104  soft-tissue]
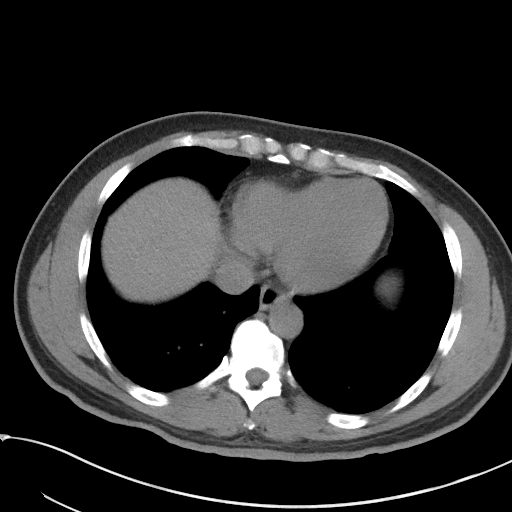
[im 99/104  soft-tissue]
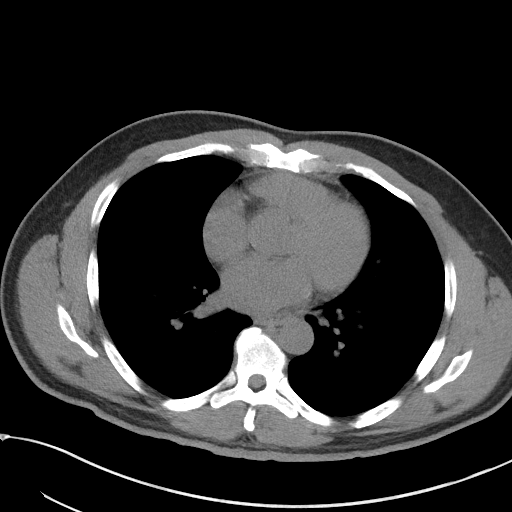

[Series 5: coronal · coronal · 0.78mm/px · 3 of 129 slices shown]
[im 43/129  soft-tissue]
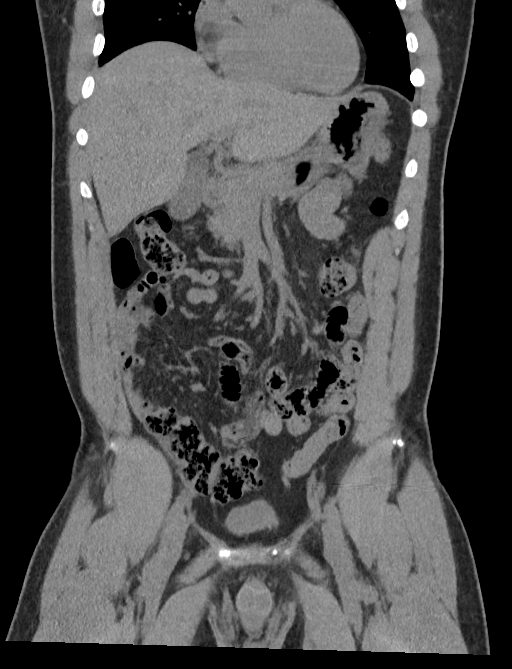
[im 57/129  soft-tissue]
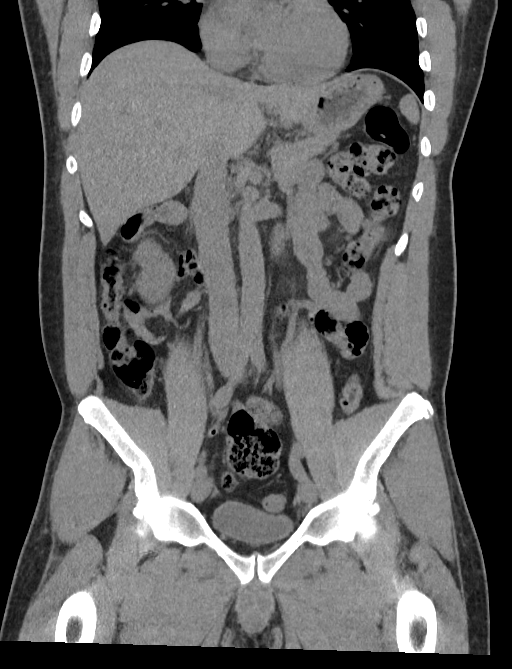
[im 72/129  soft-tissue]
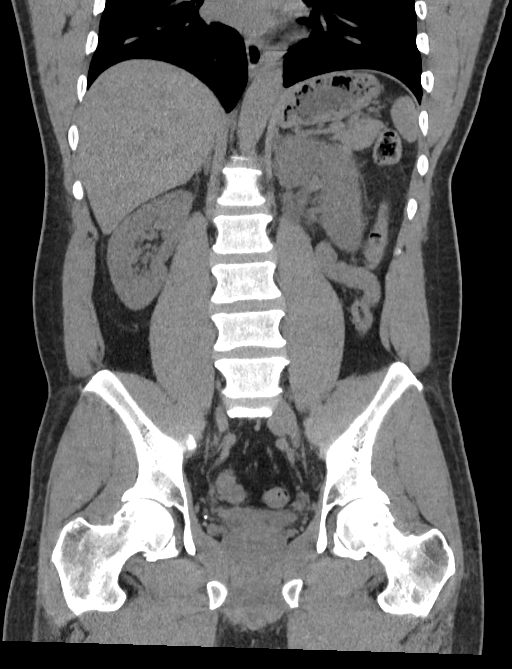

[16 of 46 positions shown; findings below may reference images not displayed]

FINDINGS: Lower chest: No acute abnormality.

Hepatobiliary: No focal liver abnormality is seen. No gallstones,
gallbladder wall thickening, or biliary dilatation.

Pancreas: Unremarkable. No pancreatic ductal dilatation or
surrounding inflammatory changes.

Spleen: Normal in size without focal abnormality.

Adrenals/Urinary Tract: No adrenal masses.

Mild to moderate left hydronephrosis and hydroureter. This is due to
a 1-2 mm stone at the left ureterovesicular junction.

No right hydronephrosis. There are several nonobstructing stones
within the left kidney. No right intrarenal stones. No convincing
renal masses. Normal right ureter. Bladder unremarkable.

Stomach/Bowel: Stomach is within normal limits. Appendix appears
normal. No evidence of bowel wall thickening, distention, or
inflammatory changes.

Vascular/Lymphatic: No significant vascular findings are present. No
enlarged abdominal or pelvic lymph nodes.

Reproductive: Unremarkable.

Other: No abdominal wall hernia or abnormality. No abdominopelvic
ascites.

Musculoskeletal: No fracture or acute finding.  No bone lesion.
IMPRESSION: 1. 1-2 mm stone at the left ureterovesicular junction causes mild to
moderate left hydroureteronephrosis.
2. No other acute finding within the abdomen or pelvis.
3. Several small nonobstructing stones within the left kidney.

## 2022-08-17 IMAGING — CT CT RENAL STONE PROTOCOL
2 of 4 series · 16 of 46 positions shown, 18 images · non-contrast
Comparison: CT abdomen and pelvis 01/22/2022

CLINICAL DATA: Left flank pain.  History of kidney stones.



[Series 2: stone full standard · axial · 0.73mm/px · z∈[-902,-457]mm · 13 of 99 slices shown, 15 images]
[im 5/99  soft-tissue]
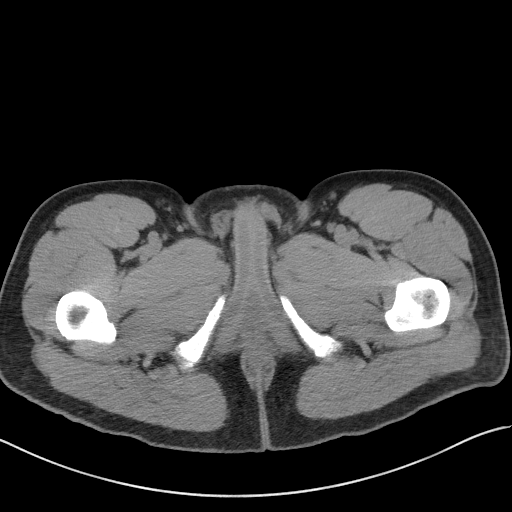
[im 5/99  bone]
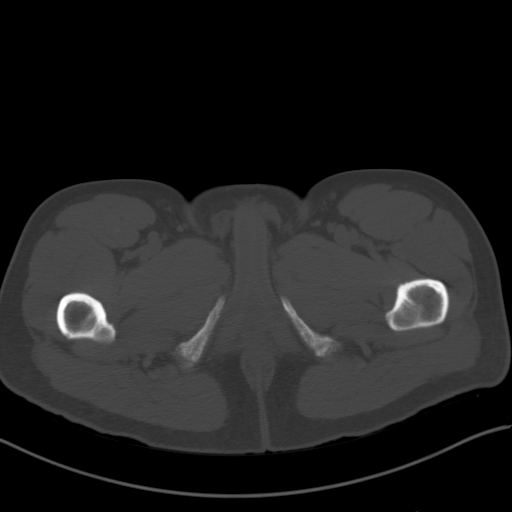
[im 13/99  soft-tissue]
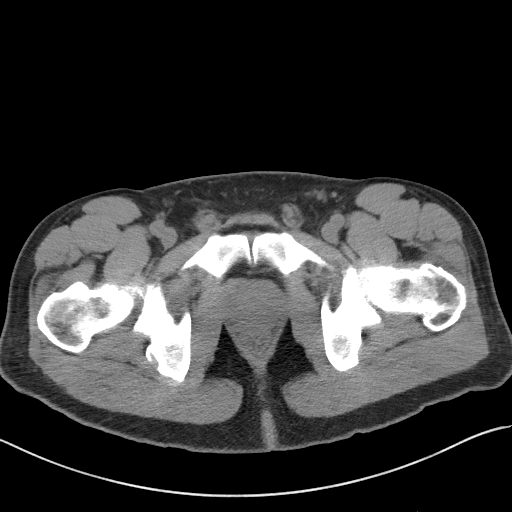
[im 22/99  soft-tissue]
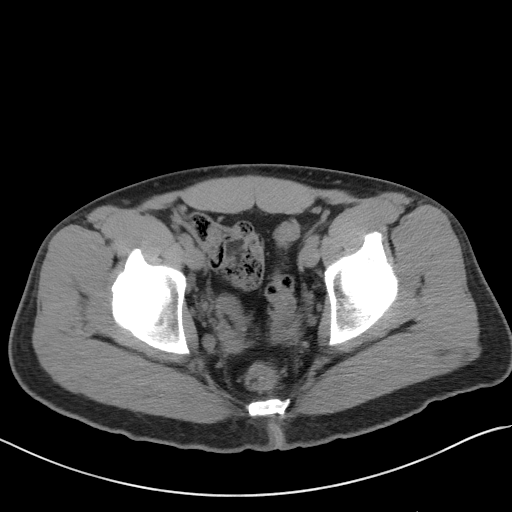
[im 26/99  soft-tissue]
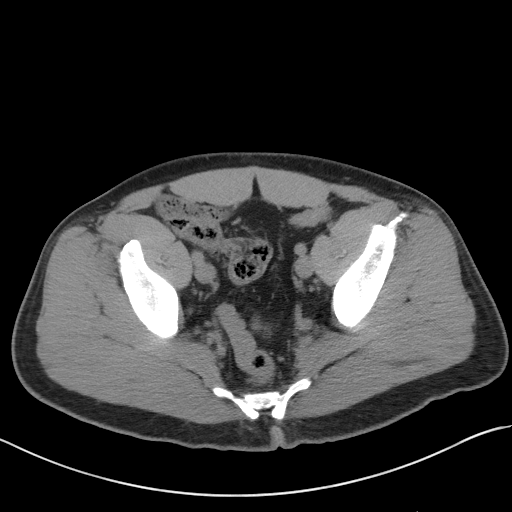
[im 35/99  soft-tissue]
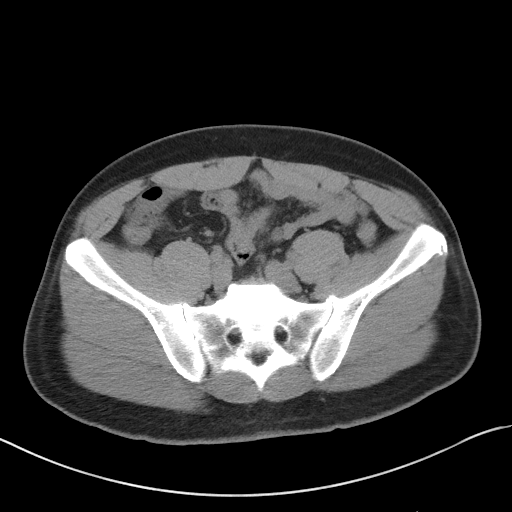
[im 43/99  soft-tissue]
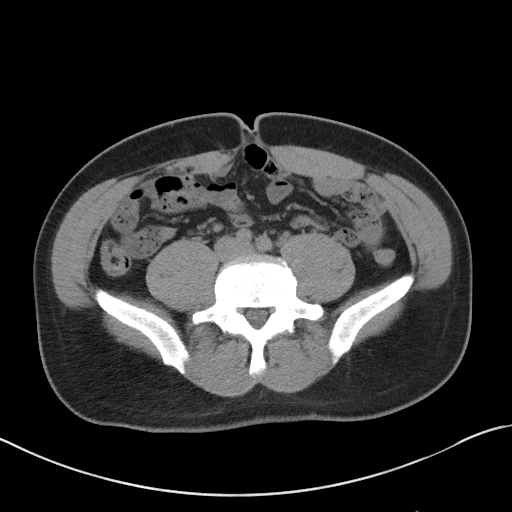
[im 52/99  soft-tissue]
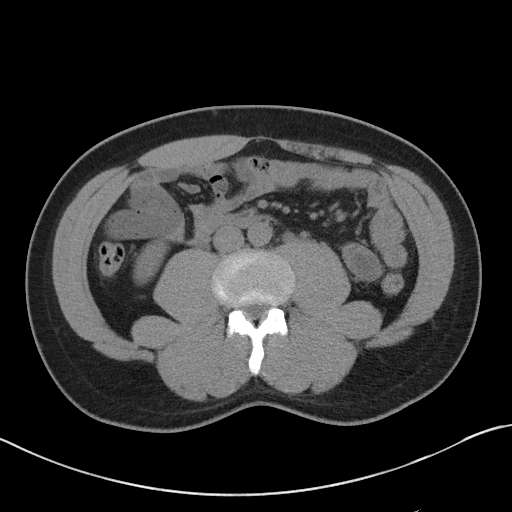
[im 56/99  soft-tissue]
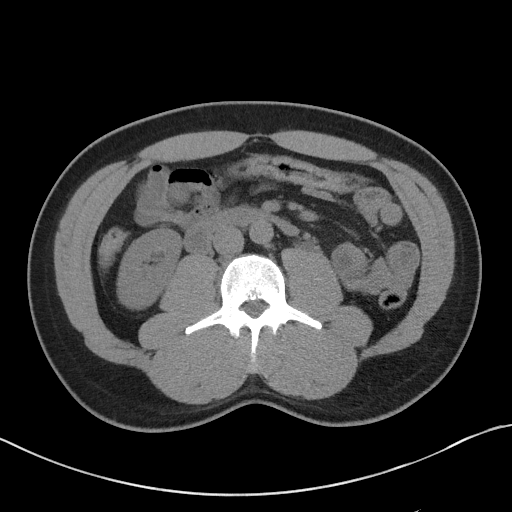
[im 64/99  soft-tissue]
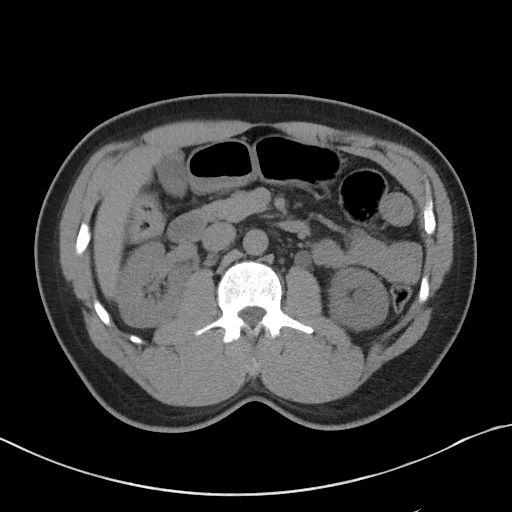
[im 64/99  bone]
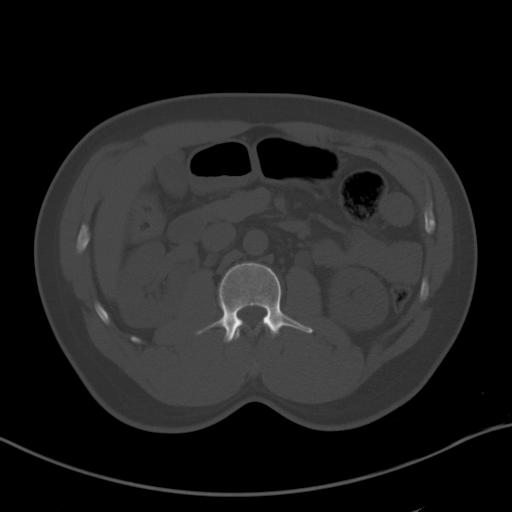
[im 73/99  soft-tissue]
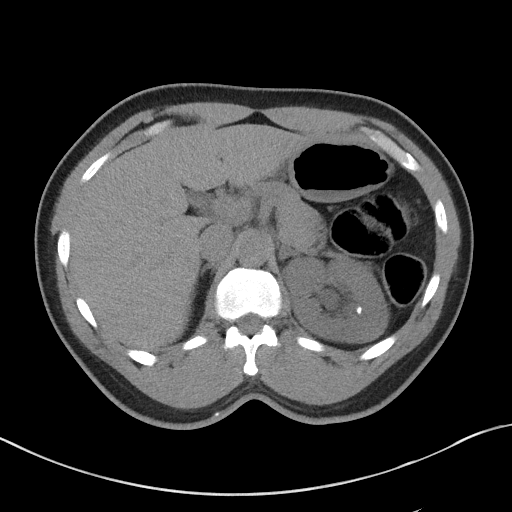
[im 77/99  soft-tissue]
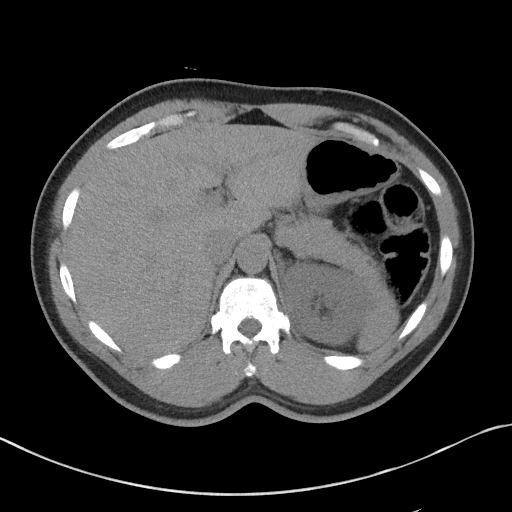
[im 86/99  soft-tissue]
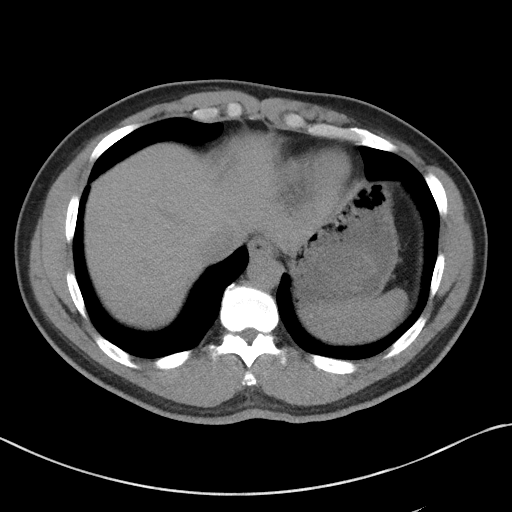
[im 94/99  soft-tissue]
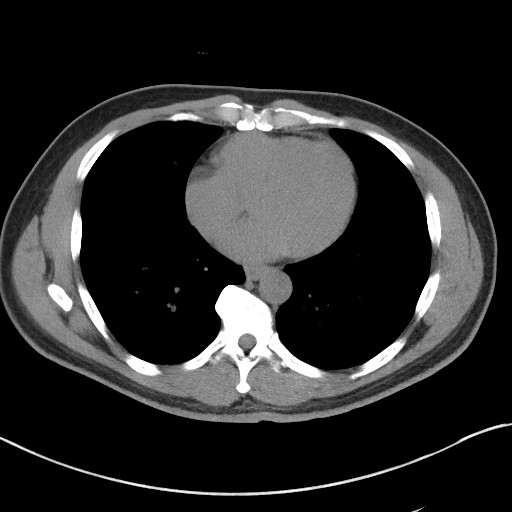

[Series 5: coronal · coronal · 0.76mm/px · 3 of 138 slices shown]
[im 46/138  soft-tissue]
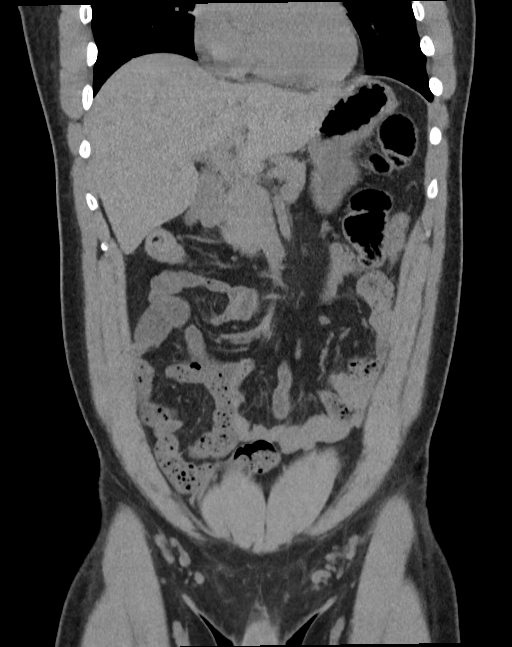
[im 61/138  soft-tissue]
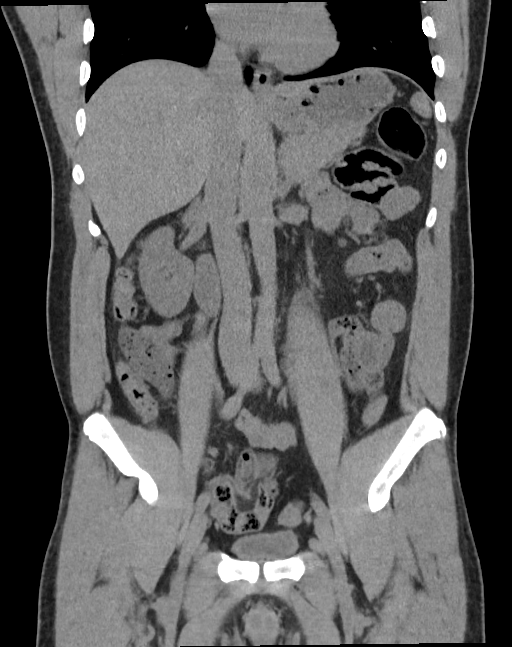
[im 77/138  soft-tissue]
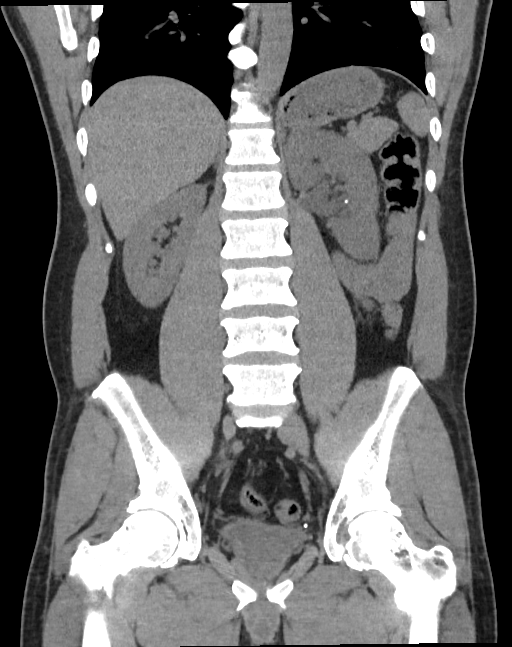

[16 of 46 positions shown; findings below may reference images not displayed]

FINDINGS: Lower chest: Slightly decreased size of a 3 mm ground-glass focus in
the right lower lobe, likely inflammatory. No pleural effusion.

Hepatobiliary: No focal liver abnormality is seen. No gallstones,
gallbladder wall thickening, or biliary dilatation.

Pancreas: Unremarkable.

Spleen: Unremarkable.

Adrenals/Urinary Tract: Unremarkable adrenal glands. 3 mm calculus
in the mid left ureter with mild to moderate left
hydroureteronephrosis. Additional small nonobstructing left renal
calculi measuring up to 3 mm. No right-sided renal calculi or
hydronephrosis. Small right renal sinus cysts for which no follow-up
imaging is recommended. Unremarkable bladder.

Stomach/Bowel: The stomach is unremarkable. There is no evidence of
bowel obstruction or inflammation. The appendix is unremarkable.

Vascular/Lymphatic: Normal caliber of the abdominal aorta. No
enlarged lymph nodes.

Reproductive: No ascites or pneumoperitoneum crash that unremarkable
prostate.

Other: No ascites or pneumoperitoneum.

Musculoskeletal: No acute osseous abnormality or suspicious osseous
lesion.
IMPRESSION: 1. 3 mm mid left ureteral calculus with mild to moderate
hydroureteronephrosis.
2. Additional small nonobstructing left renal calculi.

## 2023-02-23 ENCOUNTER — Other Ambulatory Visit: Payer: Self-pay | Admitting: Internal Medicine

## 2023-02-23 DIAGNOSIS — E042 Nontoxic multinodular goiter: Secondary | ICD-10-CM

## 2023-02-26 ENCOUNTER — Emergency Department: Payer: Medicaid Other

## 2023-02-26 ENCOUNTER — Other Ambulatory Visit: Payer: Self-pay

## 2023-02-26 ENCOUNTER — Emergency Department
Admission: EM | Admit: 2023-02-26 | Discharge: 2023-02-26 | Disposition: A | Discharge status: Home / Self Care | Payer: Medicaid Other | Attending: Emergency Medicine | Admitting: Emergency Medicine

## 2023-02-26 DIAGNOSIS — Z202 Contact with and (suspected) exposure to infections with a predominantly sexual mode of transmission: Secondary | ICD-10-CM | POA: Diagnosis not present

## 2023-02-26 DIAGNOSIS — R110 Nausea: Secondary | ICD-10-CM | POA: Diagnosis not present

## 2023-02-26 DIAGNOSIS — I10 Essential (primary) hypertension: Secondary | ICD-10-CM | POA: Diagnosis not present

## 2023-02-26 DIAGNOSIS — R519 Headache, unspecified: Secondary | ICD-10-CM

## 2023-02-26 LAB — BASIC METABOLIC PANEL
Anion gap: 6 (ref 5–15)
BUN: 13 mg/dL (ref 6–20)
CO2: 25 mmol/L (ref 22–32)
Calcium: 8.4 mg/dL — ABNORMAL LOW (ref 8.9–10.3)
Chloride: 107 mmol/L (ref 98–111)
Creatinine, Ser: 1 mg/dL (ref 0.61–1.24)
GFR, Estimated: >60 mL/min (ref >60)
Glucose, Bld: 105 mg/dL — ABNORMAL HIGH (ref 70–99)
Potassium: 4.6 mmol/L (ref 3.5–5.1)
Sodium: 138 mmol/L (ref 135–145)

## 2023-02-26 LAB — CBC WITH DIFFERENTIAL/PLATELET
Abs Immature Granulocytes: 0.01 10*3/uL (ref 0.00–0.07)
Basophils Absolute: 0 10*3/uL (ref 0.0–0.1)
Basophils Relative: 1 %
Eosinophils Absolute: 0.3 10*3/uL (ref 0.0–0.5)
Eosinophils Relative: 5 %
HCT: 39.3 % (ref 39.0–52.0)
Hemoglobin: 13.5 g/dL (ref 13.0–17.0)
Immature Granulocytes: 0 %
Lymphocytes Relative: 30 %
Lymphs Abs: 1.7 10*3/uL (ref 0.7–4.0)
MCH: 29 pg (ref 26.0–34.0)
MCHC: 34.4 g/dL (ref 30.0–36.0)
MCV: 84.5 fL (ref 80.0–100.0)
Monocytes Absolute: 0.5 10*3/uL (ref 0.1–1.0)
Monocytes Relative: 10 %
Neutro Abs: 3.1 10*3/uL (ref 1.7–7.7)
Neutrophils Relative %: 54 %
Platelets: 217 10*3/uL (ref 150–400)
RBC: 4.65 MIL/uL (ref 4.22–5.81)
RDW: 12.5 % (ref 11.5–15.5)
WBC: 5.6 10*3/uL (ref 4.0–10.5)
nRBC: 0 % (ref 0.0–0.2)

## 2023-02-26 LAB — CHLAMYDIA/NGC RT PCR (ARMC ONLY)
Chlamydia Tr: NOT DETECTED
N gonorrhoeae: NOT DETECTED

## 2023-02-26 MED ORDER — DIPHENHYDRAMINE HCL 50 MG/ML IJ SOLN
25.0000 mg | Freq: Once | INTRAMUSCULAR | Status: AC
Start: 2023-02-26 — End: 2023-02-26
  Administered 2023-02-26: 25 mg via INTRAVENOUS
  Filled 2023-02-26: qty 1

## 2023-02-26 MED ORDER — SODIUM CHLORIDE 0.9 % IV BOLUS
1000.0000 mL | Freq: Once | INTRAVENOUS | Status: AC
Start: 2023-02-26 — End: 2023-02-26
  Administered 2023-02-26: 1000 mL via INTRAVENOUS

## 2023-02-26 MED ORDER — PROCHLORPERAZINE EDISYLATE 10 MG/2ML IJ SOLN
10.0000 mg | Freq: Once | INTRAMUSCULAR | Status: AC
  Administered 2023-02-26: 10 mg via INTRAVENOUS
  Filled 2023-02-26: qty 2

## 2023-02-26 MED ORDER — SODIUM CHLORIDE 0.9 % IV SOLN
1.0000 g | Freq: Once | INTRAVENOUS | Status: AC
  Administered 2023-02-26: 1 g via INTRAVENOUS
  Filled 2023-02-26: qty 10

## 2023-02-26 MED ORDER — DOXYCYCLINE HYCLATE 100 MG PO CAPS: 100.0000 mg | ORAL_CAPSULE | Freq: Two times a day (BID) | ORAL | 0 refills | Status: AC

## 2023-02-26 NOTE — ED Provider Notes (Signed)
[name] Provider Note    Event Date/Time   First MD Initiated Contact with Patient 02/26/23 1059     (approximate)   History   Chief Complaint Headache  HPI  GIAVANNI Baxter is a 52 y.o. male with past medical history of hypertension who presents to the ED complaining of headache.  Patient reports that he has had a gradually worsening headache since last night, which she describes as now severe.  He reports headache as similar to prior migraines with some associated nausea, but has not had any photophobia or vomiting.  He denies any vision changes, speech changes, numbness or weakness in his extremities.  He has not taken anything for his headache prior to arrival.  He denies any fevers or neck stiffness.  He does state that he was also recently notified that he was exposed to a sexually transmitted infection, is not sure exactly which type.  He denies any symptoms related to this, specifically denies any dysuria, hematuria, penile discharge, or groin swelling.     Physical Exam   Triage Vital Signs: ED Triage Vitals  Enc Vitals Group     BP 02/26/23 1043 (!) 191/134     Pulse Rate 02/26/23 1043 85     Resp 02/26/23 1043 20     Temp 02/26/23 1043 98 F (36.7 C)     Temp src --      SpO2 02/26/23 1043 99 %     Weight 02/26/23 1045 212 lb (96.2 kg)     Height 02/26/23 1045 5\' 10"  (1.778 m)     Head Circumference --      Peak Flow --      Pain Score 02/26/23 1044 9     Pain Loc --      Pain Edu? --      Excl. in [place]? --     Most recent vital signs: Vitals:   02/26/23 1043  BP: (!) 191/134  Pulse: 85  Resp: 20  Temp: 98 F (36.7 C)  SpO2: 99%    Constitutional: Alert and oriented. Eyes: Conjunctivae are normal.  Pupils equal, round, and reactive to light bilaterally. Head: Atraumatic. Nose: No congestion/rhinnorhea. Mouth/Throat: Mucous membranes are moist.  Cardiovascular: Normal rate, regular rhythm. Grossly normal heart sounds.   2+ radial pulses bilaterally. Respiratory: Normal respiratory effort.  No retractions. Lungs CTAB. Gastrointestinal: Soft and nontender. No distention. Musculoskeletal: No lower extremity tenderness nor edema.  Neurologic:  Normal speech and language. No gross focal neurologic deficits are appreciated.    ED Results / Procedures / Treatments   Labs (all labs ordered are listed, but only abnormal results are displayed) Labs Reviewed  BASIC METABOLIC PANEL - Abnormal; Notable for the following components:      Result Value   Glucose, Bld 105 (*)    Calcium 8.4 (*)    All other components within normal limits  CHLAMYDIA/NGC RT PCR ([facility] ONLY)            CBC WITH DIFFERENTIAL/PLATELET     EKG  ED ECG REPORT I, Blake Divine, the attending physician, personally viewed and interpreted this ECG.   Date: 02/26/2023  EKG Time: 12:14  Rate: 73  Rhythm: normal sinus rhythm  Axis: LAD  Intervals:none  ST&T Change: None  RADIOLOGY CT head reviewed and interpreted by me with no hemorrhage or midline shift.  PROCEDURES:  Critical Care performed: No  Procedures   MEDICATIONS ORDERED IN ED: Medications  prochlorperazine (COMPAZINE) injection  10 mg (10 mg Intravenous Given 02/26/23 1202)  diphenhydrAMINE (BENADRYL) injection 25 mg (25 mg Intravenous Given 02/26/23 1202)  sodium chloride 0.9 % bolus 1,000 mL (1,000 mLs Intravenous New Bag/Given 02/26/23 1202)  cefTRIAXone (ROCEPHIN) 1 g in sodium chloride 0.9 % 100 mL IVPB (1 g Intravenous New Bag/Given 02/26/23 1202)     IMPRESSION / MDM / ASSESSMENT AND PLAN / ED COURSE  I reviewed the triage vital signs and the nursing notes.                              52 y.o. male with past medical history of hypertension who presents to the ED complaining of gradually worsening and now severe headache since last night, also reports recent exposure to sexually transmitted infection.  Patient's presentation is most consistent with acute  presentation with potential threat to life or bodily function.  Differential diagnosis includes, but is not limited to, SAH, meningitis, migraine headache, tension headache, hypertensive emergency, AKI, electrode abnormality, anemia, urethritis.  Patient nontoxic-appearing and in no acute distress, vital signs remarkable for elevated blood pressure but otherwise reassuring.  Patient reports gradually worsening headache similar to prior migraines, low suspicion for [facility] and he has a nonfocal neurologic exam.  However, given his markedly elevated blood pressure, we will check CT head.  No findings concerning for meningitis, will treat symptomatically with IV Compazine and Benadryl.  EKG and labs are pending at this time, will also send urine for STD testing.  Patient requesting empiric treatment for sexually transmitted infections.  CT head is negative for acute process, labs are reassuring with no significant anemia, leukocytosis, lecture abnormality, or AKI.  EKG shows no evidence of arrhythmia or ischemia, no evidence of hypertensive emergency at this time.  Patient reports headache is improved on reassessment and he is appropriate for outpatient management.  He was counseled to follow-up with his PCP for recheck of blood pressure, states he has plenty of his blood pressure medication available at home.  He was counseled to return to the ED for new or worsening symptoms, patient agrees with plan.      FINAL CLINICAL IMPRESSION(S) / ED DIAGNOSES   Final diagnoses:  Acute nonintractable headache, unspecified headache type  Uncontrolled hypertension  STD exposure     Rx / DC Orders   ED Discharge Orders     None        Note:  This document was prepared using Dragon voice recognition software and may include unintentional dictation errors.   Blake Divine, MD 02/26/23 [phone]

## 2023-02-26 NOTE — ED Notes (Signed)
Pt was made aware that his sexual partner had unprotected sex with someone else and then he had sex with her afterward. Pt denies any genitourinary symptoms but wants to be checked for STDs due to the situation.

## 2023-02-26 NOTE — ED Triage Notes (Signed)
Pt states he wants to be checked for an STD. Pt states no burning with urination, no discharge and no pain, except a headache.  Pt states he thinks his BP is elevated because he is mad and embarrassed.

## 2023-03-07 ENCOUNTER — Other Ambulatory Visit: Payer: Medicaid Other

## 2023-03-15 ENCOUNTER — Ambulatory Visit (AMBULATORY_SURGERY_CENTER): Payer: Medicaid Other

## 2023-03-15 VITALS — Ht 70.0 in | Wt 220.0 lb

## 2023-03-15 DIAGNOSIS — Z1211 Encounter for screening for malignant neoplasm of colon: Secondary | ICD-10-CM

## 2023-03-15 MED ORDER — PLENVU 140 G PO SOLR: 1.0000 | ORAL | 0 refills | Status: DC

## 2023-03-15 NOTE — Progress Notes (Signed)
Pre visit completed via phone call; Patient verified name, DOB, and address;  No egg or soy allergy known to patient;  No issues known to pt with past sedation with any surgeries or procedures; Patient denies ever being told they had issues or difficulty with intubation;  No FH of Malignant Hyperthermia; Pt is not on diet pills; Pt is not on home 02;  Pt is not on blood thinners;  Pt denies issues with constipation  No A fib or A flutter; Have any cardiac testing pending--NO Pt instructed to use [email or web address] or GoodRx for a price reduction on prep;   Insurance verified during PV appt=[place] Medicaid [employer]  Previsit completed and red dot placed by patient's name on their procedure day (on provider's schedule).    Patient has requested that instructions be sent to his MyChart account;

## 2023-03-16 ENCOUNTER — Ambulatory Visit
Admission: RE | Admit: 2023-03-16 | Discharge: 2023-03-16 | Disposition: A | Discharge status: Home / Self Care | Payer: Medicaid Other | Source: Ambulatory Visit | Attending: Internal Medicine | Admitting: Internal Medicine

## 2023-03-16 DIAGNOSIS — E042 Nontoxic multinodular goiter: Secondary | ICD-10-CM

## 2023-03-24 ENCOUNTER — Encounter: Payer: Self-pay | Admitting: Internal Medicine

## 2023-03-24 DIAGNOSIS — E042 Nontoxic multinodular goiter: Secondary | ICD-10-CM

## 2023-03-27 ENCOUNTER — Encounter: Payer: Medicaid Other | Admitting: Gastroenterology

## 2023-03-27 ENCOUNTER — Telehealth: Payer: Self-pay | Admitting: Gastroenterology

## 2023-03-27 NOTE — Telephone Encounter (Signed)
Good Morning Dr. Tomasa Rand,  I called this patient at 10:45 am to see if he was coming for his appointment.  He stated he had forgotten about this procedure.   He did not start prep either.  I explained he would have to reschedule appointment.  I will NO SHOW PATIENT  Medicaid.

## 2023-04-04 ENCOUNTER — Other Ambulatory Visit: Payer: Self-pay | Admitting: Internal Medicine

## 2023-04-04 DIAGNOSIS — E042 Nontoxic multinodular goiter: Secondary | ICD-10-CM

## 2023-04-11 ENCOUNTER — Other Ambulatory Visit: Payer: Self-pay | Admitting: Internal Medicine

## 2023-04-11 DIAGNOSIS — Q7649 Other congenital malformations of spine, not associated with scoliosis: Secondary | ICD-10-CM

## 2023-04-14 ENCOUNTER — Ambulatory Visit (AMBULATORY_SURGERY_CENTER): Payer: Medicaid Other | Admitting: Gastroenterology

## 2023-04-14 ENCOUNTER — Encounter: Payer: Self-pay | Admitting: Gastroenterology

## 2023-04-14 VITALS — BP 144/90 | HR 67 | Temp 98.4°F | Resp 16 | Ht 70.0 in | Wt 220.0 lb

## 2023-04-14 DIAGNOSIS — Z1211 Encounter for screening for malignant neoplasm of colon: Secondary | ICD-10-CM

## 2023-04-14 HISTORY — PX: COLONOSCOPY: SHX174

## 2023-04-14 MED ORDER — SODIUM CHLORIDE 0.9 % IV SOLN: 500.0000 mL | Freq: Once | INTRAVENOUS | Status: DC

## 2023-04-14 NOTE — Progress Notes (Unsigned)
[facility] History and Physical   Primary Care Physician:  Nadyne Coombes, MD   Reason for Procedure:   Colon cancer screening  Plan:    Screening colonoscopy     HPI: Micheal Baxter is a 52 y.o. male undergoing initial average risk screening colonoscopy.  He has no family history of colon cancer and no chronic GI symptoms.    Past Medical History:  Diagnosis Date   COVID-19 12/04/2020   Asymptomatic   Hypertension    on meds   Migraines     Past Surgical History:  Procedure Laterality Date   TONSILLECTOMY Bilateral 04/14/2021   Procedure: TONSILLECTOMY;  Surgeon: Geanie Logan, MD;  Location: [facility] ORS;  Service: ENT;  Laterality: Bilateral;    Prior to Admission medications   Medication Sig Start Date End Date Taking? Authorizing Provider  losartan-hydrochlorothiazide (HYZAAR) 100-25 MG tablet Take 1 tablet by mouth daily. 02/22/23  Yes [provider]  gabapentin (NEURONTIN) 100 MG capsule Take 200 mg by mouth at bedtime. 04/05/23   [provider]  methocarbamol (ROBAXIN) 500 MG tablet Take 1,000 mg by mouth at bedtime as needed for muscle spasms. 02/22/23   [provider]  ondansetron (ZOFRAN-ODT) 4 MG disintegrating tablet Take 1 tablet (4 mg total) by mouth every 8 (eight) hours as needed for nausea or vomiting. Patient not taking: Reported on 04/14/2023 03/15/22   Minna Antis, MD    Current Outpatient Medications  Medication Sig Dispense Refill   losartan-hydrochlorothiazide (HYZAAR) 100-25 MG tablet Take 1 tablet by mouth daily.     gabapentin (NEURONTIN) 100 MG capsule Take 200 mg by mouth at bedtime.     methocarbamol (ROBAXIN) 500 MG tablet Take 1,000 mg by mouth at bedtime as needed for muscle spasms.     ondansetron (ZOFRAN-ODT) 4 MG disintegrating tablet Take 1 tablet (4 mg total) by mouth every 8 (eight) hours as needed for nausea or vomiting. (Patient not taking: Reported on 04/14/2023) 20 tablet 0   Current  Facility-Administered Medications  Medication Dose Route Frequency Provider Last Rate Last Admin   0.9 %  sodium chloride infusion  500 mL Intravenous Once Jenel Lucks, MD        Allergies as of 04/14/2023 - Review Complete 04/14/2023  Allergen Reaction Noted   No known allergies  06/17/2021    Family History  Problem Relation Age of Onset   Healthy Mother    Healthy Father 40   Hypertension Maternal Grandfather    Diabetes Maternal Grandfather    Lung cancer Maternal Grandfather    Colon polyps Neg Hx    Colon cancer Neg Hx    Esophageal cancer Neg Hx    Rectal cancer Neg Hx    Stomach cancer Neg Hx     Social History   Socioeconomic History   Marital status: Single    Spouse name: Not on file   Number of children: 7   Years of education: Not on file   Highest education level: Not on file  Occupational History   Not on file  Tobacco Use   Smoking status: Never   Smokeless tobacco: Never  Vaping Use   Vaping Use: Former   Start date: 12/29/2020  Substance and Sexual Activity   Alcohol use: Not Currently   Drug use: Not Currently    Types: Marijuana   Sexual activity: Yes    Birth control/protection: Condom  Other Topics Concern   Not on file  Social History Narrative  Not on file   Social Determinants of Health   Financial Resource Strain: Not on file  Food Insecurity: No Food Insecurity (06/17/2021)   Hunger Vital Sign    Worried About Running Out of Food in the Last Year: Never true    Ran Out of Food in the Last Year: Never true  Transportation Needs: No Transportation Needs (06/17/2021)   PRAPARE - Administrator, Civil Service (Medical): No    Lack of Transportation (Non-Medical): No  Physical Activity: Not on file  Stress: Not on file  Social Connections: Not on file  Intimate Partner Violence: Not on file    Review of Systems:  All other review of systems negative except as mentioned in the HPI.  Physical Exam: Vital  signs BP (!) 150/100   Pulse 64   Temp 98.4 F (36.9 C) (Temporal)   Resp 10   Ht 5\' 10"  (1.778 m)   Wt 220 lb (99.8 kg)   SpO2 100%   BMI 31.57 kg/m   General:   Alert,  Well-developed, well-nourished, pleasant and cooperative in NAD Airway:  Mallampati 2 Lungs:  Clear throughout to auscultation.   Heart:  Regular rate and rhythm; no murmurs, clicks, rubs,  or gallops. Abdomen:  Soft, nontender and nondistended. Normal bowel sounds.   Neuro/Psych:  Normal mood and affect. A and O x 3   Denson Niccoli E. Tomasa Rand, MD [facility]

## 2023-04-14 NOTE — Progress Notes (Unsigned)
Pt's states no medical or surgical changes since previsit or office visit. 

## 2023-04-14 NOTE — Op Note (Signed)
[facility] Patient Name: Micheal Baxter Procedure Date: 04/14/2023 2:33 PM MRN: [id number] Endoscopist: Lorin Picket E. Tomasa Rand , MD, [id number] Age: 52 Referring MD:  Date of Birth: 1971/07/13 Gender: Male Account #: [id number] Procedure:                Colonoscopy Indications:              Screening for colorectal malignant neoplasm, This                            is the patient's first colonoscopy Medicines:                Monitored Anesthesia Care Procedure:                Pre-Anesthesia Assessment:                           - Prior to the procedure, a History and Physical                            was performed, and patient medications and                            allergies were reviewed. The patient's tolerance of                            previous anesthesia was also reviewed. The risks                            and benefits of the procedure and the sedation                            options and risks were discussed with the patient.                            All questions were answered, and informed consent                            was obtained. Prior Anticoagulants: The patient has                            taken no anticoagulant or antiplatelet agents. ASA                            Grade Assessment: II - A patient with mild systemic                            disease. After reviewing the risks and benefits,                            the patient was deemed in satisfactory condition to                            undergo the procedure.  After obtaining informed consent, the colonoscope                            was passed under direct vision. Throughout the                            procedure, the patient's blood pressure, pulse, and                            oxygen saturations were monitored continuously. The                            CF [id number] #[id number] was introduced through the anus                            and advanced to  the the cecum, identified by                            appendiceal orifice and ileocecal valve. The                            colonoscopy was performed without difficulty. The                            patient tolerated the procedure well. The quality                            of the bowel preparation was adequate. The                            ileocecal valve, appendiceal orifice, and rectum                            were photographed. The bowel preparation used was                            Plenvu via split dose instruction. Scope In: 2:41:59 PM Scope Out: 2:57:35 PM Scope Withdrawal Time: 0 hours 9 minutes 54 seconds  Total Procedure Duration: 0 hours 15 minutes 36 seconds  Findings:                 The perianal and digital rectal examinations were                            normal. Pertinent negatives include normal                            sphincter tone and no palpable rectal lesions.                           A few small-mouthed diverticula were found in the                            sigmoid colon.  The exam was otherwise normal throughout the                            examined colon.                           The retroflexed view of the distal rectum and anal                            verge was normal and showed no anal or rectal                            abnormalities. Complications:            No immediate complications. Estimated Blood Loss:     Estimated blood loss: none. Impression:               - Diverticulosis in the sigmoid colon.                           - The distal rectum and anal verge are normal on                            retroflexion view.                           - No specimens collected. Recommendation:           - Patient has a contact number available for                            emergencies. The signs and symptoms of potential                            delayed complications were discussed with the                             patient. Return to normal activities tomorrow.                            Written discharge instructions were provided to the                            patient.                           - Resume previous diet.                           - Continue present medications.                           - Repeat colonoscopy in 10 years for screening                            purposes. Avarae Zwart E. Tomasa Rand, MD 04/14/2023 3:01:52 PM This report has been signed electronically.

## 2023-04-14 NOTE — Progress Notes (Unsigned)
A and O x3. Report to RN. Tolerated MAC anesthesia well. 

## 2023-04-14 NOTE — Patient Instructions (Addendum)
Resume previous diet Continue present medications There were no polyps seen today!   You will need another screening colonoscopy in 10 years, you will receive a letter at that time when you are due for the procedure.   Please call us at [phone] if you have a change in bowel habits, change in family history of colo-rectal cancer, rectal bleeding or other GI concern before that time. Handouts/information given for diverticulosis   YOU HAD AN ENDOSCOPIC PROCEDURE TODAY AT THE [facility]:   Refer to the procedure report that was given to you for any specific questions about what was found during the examination.  If the procedure report does not answer your questions, please call your gastroenterologist to clarify.  If you requested that your care partner not be given the details of your procedure findings, then the procedure report has been included in a sealed envelope for you to review at your convenience later.  YOU SHOULD EXPECT: Some feelings of bloating in the abdomen. Passage of more gas than usual.  Walking can help get rid of the air that was put into your GI tract during the procedure and reduce the bloating. If you had a lower endoscopy (such as a colonoscopy or flexible sigmoidoscopy) you may notice spotting of blood in your stool or on the toilet paper. If you underwent a bowel prep for your procedure, you may not have a normal bowel movement for a few days.  Please Note:  You might notice some irritation and congestion in your nose or some drainage.  This is from the oxygen used during your procedure.  There is no need for concern and it should clear up in a day or so.  SYMPTOMS TO REPORT IMMEDIATELY:  Following lower endoscopy (colonoscopy):  Excessive amounts of blood in the stool  Significant tenderness or worsening of abdominal pains  Swelling of the abdomen that is new, acute  Fever of 100F or higher  For urgent or emergent issues, a gastroenterologist can be  reached at any hour by calling [phone]. Do not use MyChart messaging for urgent concerns.   DIET:  We do recommend a small meal at first, but then you may proceed to your regular diet.  Drink plenty of fluids but you should avoid alcoholic beverages or THC products for 24 hours.  ACTIVITY:  You should plan to take it easy for the rest of today and you should NOT DRIVE or use heavy machinery until tomorrow (because of the sedation medicines used during the test).    FOLLOW UP: Our staff will call the number listed on your records the next business day following your procedure.  We will call around 7:15- 8:00 am to check on you and address any questions or concerns that you may have regarding the information given to you following your procedure. If we do not reach you, we will leave a message.     SIGNATURES/CONFIDENTIALITY: You and/or your care partner have signed paperwork which will be entered into your electronic medical record.  These signatures attest to the fact that that the information above on your After Visit Summary has been reviewed and is understood.  Full responsibility of the confidentiality of this discharge information lies with you and/or your care-partner.

## 2023-04-17 ENCOUNTER — Telehealth: Payer: Self-pay | Admitting: *Deleted

## 2023-04-17 NOTE — Telephone Encounter (Signed)
  Follow up Call-     04/14/2023    1:57 PM  Call back number  Post procedure Call Back phone  # [phone]  Permission to leave phone message Yes     Post procedure follow up phone call. No answer at number given.  Unable to LM d/t full VM.

## 2023-04-20 ENCOUNTER — Other Ambulatory Visit (HOSPITAL_COMMUNITY)
Admission: RE | Admit: 2023-04-20 | Discharge: 2023-04-20 | Disposition: A | Discharge status: Home / Self Care | Payer: Medicaid Other | Source: Ambulatory Visit | Attending: Interventional Radiology | Admitting: Interventional Radiology

## 2023-04-20 ENCOUNTER — Ambulatory Visit
Admission: RE | Admit: 2023-04-20 | Discharge: 2023-04-20 | Disposition: A | Discharge status: Home / Self Care | Payer: Medicaid Other | Source: Ambulatory Visit | Attending: Internal Medicine | Admitting: Internal Medicine

## 2023-04-20 DIAGNOSIS — E042 Nontoxic multinodular goiter: Secondary | ICD-10-CM

## 2023-04-21 ENCOUNTER — Ambulatory Visit
Admission: RE | Admit: 2023-04-21 | Discharge: 2023-04-21 | Disposition: A | Discharge status: Home / Self Care | Payer: Medicaid Other | Source: Ambulatory Visit | Attending: Internal Medicine | Admitting: Internal Medicine

## 2023-04-21 DIAGNOSIS — Q7649 Other congenital malformations of spine, not associated with scoliosis: Secondary | ICD-10-CM | POA: Diagnosis present

## 2023-04-25 LAB — CYTOLOGY - NON PAP

## 2023-05-11 NOTE — Telephone Encounter (Signed)
Error

## 2023-05-12 ENCOUNTER — Encounter (HOSPITAL_COMMUNITY): Payer: Self-pay

## 2023-06-27 ENCOUNTER — Ambulatory Visit: Payer: Self-pay | Admitting: Surgery

## 2023-07-17 ENCOUNTER — Other Ambulatory Visit: Payer: Self-pay | Admitting: Surgery

## 2023-07-17 DIAGNOSIS — E042 Nontoxic multinodular goiter: Secondary | ICD-10-CM

## 2023-07-19 ENCOUNTER — Encounter (HOSPITAL_COMMUNITY)
Admission: RE | Admit: 2023-07-19 | Discharge: 2023-07-19 | Disposition: A | Discharge status: Home / Self Care | Payer: Medicaid Other | Source: Ambulatory Visit | Attending: Surgery | Admitting: Surgery

## 2023-07-19 ENCOUNTER — Other Ambulatory Visit: Payer: Self-pay

## 2023-07-19 ENCOUNTER — Encounter (HOSPITAL_COMMUNITY): Payer: Self-pay

## 2023-07-19 VITALS — BP 140/105 | HR 82 | Temp 98.4°F | Resp 16 | Ht 70.0 in | Wt 200.0 lb

## 2023-07-19 DIAGNOSIS — I10 Essential (primary) hypertension: Secondary | ICD-10-CM | POA: Diagnosis not present

## 2023-07-19 DIAGNOSIS — Z01818 Encounter for other preprocedural examination: Secondary | ICD-10-CM | POA: Diagnosis not present

## 2023-07-19 DIAGNOSIS — R9431 Abnormal electrocardiogram [ECG] [EKG]: Secondary | ICD-10-CM | POA: Diagnosis not present

## 2023-07-19 HISTORY — DX: Personal history of urinary calculi: Z87.442

## 2023-07-19 HISTORY — DX: Scoliosis, unspecified: M41.9

## 2023-07-19 HISTORY — DX: Other complications of anesthesia, initial encounter: T88.59XA

## 2023-07-19 HISTORY — DX: Unspecified intracranial injury with loss of consciousness status unknown, initial encounter: S06.9XAA

## 2023-07-19 HISTORY — DX: Dyspnea, unspecified: R06.00

## 2023-07-19 HISTORY — DX: Cerebral infarction, unspecified: I63.9

## 2023-07-19 HISTORY — DX: Unspecified injury of head, initial encounter: S09.90XA

## 2023-07-19 HISTORY — DX: Other injury of unspecified body region, initial encounter: T14.8XXA

## 2023-07-19 LAB — BASIC METABOLIC PANEL
Anion gap: 8 (ref 5–15)
BUN: 17 mg/dL (ref 6–20)
CO2: 22 mmol/L (ref 22–32)
Calcium: 8.5 mg/dL — ABNORMAL LOW (ref 8.9–10.3)
Chloride: 106 mmol/L (ref 98–111)
Creatinine, Ser: 0.95 mg/dL (ref 0.61–1.24)
GFR, Estimated: >60 mL/min (ref >60)
Glucose, Bld: 102 mg/dL — ABNORMAL HIGH (ref 70–99)
Potassium: 3.9 mmol/L (ref 3.5–5.1)
Sodium: 136 mmol/L (ref 135–145)

## 2023-07-19 LAB — CBC
HCT: 41.7 % (ref 39.0–52.0)
Hemoglobin: 14.1 g/dL (ref 13.0–17.0)
MCH: 29.2 pg (ref 26.0–34.0)
MCHC: 33.8 g/dL (ref 30.0–36.0)
MCV: 86.3 fL (ref 80.0–100.0)
Platelets: 237 10*3/uL (ref 150–400)
RBC: 4.83 MIL/uL (ref 4.22–5.81)
RDW: 12.9 % (ref 11.5–15.5)
WBC: 4.9 10*3/uL (ref 4.0–10.5)
nRBC: 0 % (ref 0.0–0.2)

## 2023-07-19 NOTE — Progress Notes (Addendum)
COVID Vaccine Completed:no   Date of COVID positive in last 90 days: no  PCP - Nadyne Coombes, MD Cardiologist - n/a  Chest x-ray -  EKG - 07/19/23 Epic/chart Stress Test - yes  ECHO -  Cardiac Cath - n/a Pacemaker/ICD device last checked: n/a Spinal Cord Stimulator: n/a  Bowel Prep - no  Sleep Study - n/a CPAP -   Fasting Blood Sugar - n/a Checks Blood Sugar _____ times a day  Last dose of GLP1 agonist-  N/A GLP1 instructions:  N/A   Last dose of SGLT-2 inhibitors-  N/A SGLT-2 instructions: N/A   Blood Thinner Instructions: n/a Aspirin Instructions: Last Dose:  Activity level: Can go up a flight of stairs and perform activities of daily living without stopping and without symptoms of chest pain or shortness of breath. SOB with activity  Anesthesia review: BP 149/107 and 140/105, increased SOB  Patient denies shortness of breath, fever, cough and chest pain at PAT appointment  Patient verbalized understanding of instructions that were given to them at the PAT appointment. Patient was also instructed that they will need to review over the PAT instructions again at home before surgery.

## 2023-07-19 NOTE — Patient Instructions (Addendum)
SURGICAL WAITING ROOM VISITATION  Patients having surgery or a procedure may have no more than 2 support people in the waiting area - these visitors may rotate.    Children under the age of 31 must have an adult with them who is not the patient.  Due to an increase in RSV and influenza rates and associated hospitalizations, children ages 36 and under may not visit patients in [facility] hospitals.  If the patient needs to stay at the hospital during part of their recovery, the visitor guidelines for inpatient rooms apply. Pre-op nurse will coordinate an appropriate time for 1 support person to accompany patient in pre-op.  This support person may not rotate.    Please refer to the [facility] website for the visitor guidelines for Inpatients (after your surgery is over and you are in a regular room).    Your procedure is scheduled on: 08/04/23   Report to [facility] Main Entrance    Report to admitting at 10:45 AM   Call this number if you have problems the morning of surgery [phone]   Do not eat food :After Midnight.   After Midnight you may have the following liquids until 10:00 AM DAY OF SURGERY  Water Non-Citrus Juices (without pulp, NO RED-Apple, White grape, White cranberry) Black Coffee (NO MILK/CREAM OR CREAMERS, sugar ok)  Clear Tea (NO MILK/CREAM OR CREAMERS, sugar ok) regular and decaf                             Plain Jell-O (NO RED)                                           Fruit ices (not with fruit pulp, NO RED)                                     Popsicles (NO RED)                                                               Sports drinks like Gatorade (NO RED)                      If you have questions, please contact your surgeon's office.   FOLLOW BOWEL PREP AND ANY ADDITIONAL PRE OP INSTRUCTIONS YOU RECEIVED FROM YOUR SURGEON'S OFFICE!!!     Oral Hygiene is also important to reduce your risk of infection.                                     Remember - BRUSH YOUR TEETH THE MORNING OF SURGERY WITH YOUR REGULAR TOOTHPASTE  DENTURES WILL BE REMOVED PRIOR TO SURGERY PLEASE DO NOT APPLY "Poly grip" OR ADHESIVES!!!   Stop all vitamins and herbal supplements 7 days before surgery.   Take these medicines the morning of surgery with A SIP OF WATER: None  You may not have any metal on your body including jewelry, and body piercing             Do not wear lotions, powders, cologne, or deodorant              Men may shave face and neck.   Do not bring valuables to the hospital. [name] IS NOT             RESPONSIBLE   FOR VALUABLES.   Contacts, glasses, dentures or bridgework may not be worn into surgery.   Bring small overnight bag day of surgery.   DO NOT BRING YOUR HOME MEDICATIONS TO THE HOSPITAL. PHARMACY WILL DISPENSE MEDICATIONS LISTED ON YOUR MEDICATION LIST TO YOU DURING YOUR ADMISSION IN THE HOSPITAL!              Please read over the following fact sheets you were given: IF YOU HAVE QUESTIONS ABOUT YOUR PRE-OP INSTRUCTIONS PLEASE CALL [name]   If you received a COVID test during your pre-op visit  it is requested that you wear a mask when out in public, stay away from anyone that may not be feeling well and notify your surgeon if you develop symptoms. If you test positive for Covid or have been in contact with anyone that has tested positive in the last 10 days please notify you surgeon.     - Preparing for Surgery Before surgery, you can play an important role.  Because skin is not sterile, your skin needs to be as free of germs as possible.  You can reduce the number of germs on your skin by washing with CHG (chlorahexidine gluconate) soap before surgery.  CHG is an antiseptic cleaner which kills germs and bonds with the skin to continue killing germs even after washing. Please DO NOT use if you have an allergy to CHG or antibacterial soaps.  If your skin becomes  reddened/irritated stop using the CHG and inform your nurse when you arrive at Short Stay. Do not shave (including legs and underarms) for at least 48 hours prior to the first CHG shower.  You may shave your face/neck.  Please follow these instructions carefully:  1.  Shower with CHG Soap the night before surgery and the  morning of surgery.  2.  If you choose to wash your hair, wash your hair first as usual with your normal  shampoo.  3.  After you shampoo, rinse your hair and body thoroughly to remove the shampoo.                             4.  Use CHG as you would any other liquid soap.  You can apply chg directly to the skin and wash.  Gently with a scrungie or clean washcloth.  5.  Apply the CHG Soap to your body ONLY FROM THE NECK DOWN.   Do   not use on face/ open                           Wound or open sores. Avoid contact with eyes, ears mouth and   genitals (private parts).                       Wash face,  Genitals (private parts) with your normal soap.  6.  Wash thoroughly, paying special attention to the area where your    surgery  will be performed.  7.  Thoroughly rinse your body with warm water from the neck down.  8.  DO NOT shower/wash with your normal soap after using and rinsing off the CHG Soap.                9.  Pat yourself dry with a clean towel.            10.  Wear clean pajamas.            11.  Place clean sheets on your bed the night of your first shower and do not  sleep with pets. Day of Surgery : Do not apply any lotions/deodorants the morning of surgery.  Please wear clean clothes to the hospital/surgery center.  FAILURE TO FOLLOW THESE INSTRUCTIONS MAY RESULT IN THE CANCELLATION OF YOUR SURGERY  PATIENT SIGNATURE_________________________________  NURSE SIGNATURE__________________________________  ________________________________________________________________________

## 2023-07-26 ENCOUNTER — Encounter (HOSPITAL_COMMUNITY): Payer: Self-pay

## 2023-07-26 NOTE — Anesthesia Preprocedure Evaluation (Addendum)
Anesthesia Evaluation  Patient identified by MRN, date of birth, ID band Patient awake    Reviewed: Allergy & Precautions, NPO status , Patient's Chart, lab work & pertinent test results  History of Anesthesia Complications (+) PROLONGED EMERGENCE and history of anesthetic complications  Airway Mallampati: III  TM Distance: >3 FB Neck ROM: Full    Dental  (+) Teeth Intact, Dental Advisory Given   Pulmonary shortness of breath and with exertion   Pulmonary exam normal breath sounds clear to auscultation       Cardiovascular hypertension, Pt. on medications Normal cardiovascular exam Rhythm:Regular Rate:Normal     Neuro/Psych  Headaches Hx/o TBI CVA, No Residual Symptoms  negative psych ROS   GI/Hepatic negative GI ROS, Neg liver ROS,,,  Endo/Other  Multinodular thyroid goiter  Renal/GU Hx/o renal calculi  negative genitourinary   Musculoskeletal Hx/o scoliosis   Abdominal   Peds  Hematology negative hematology ROS (+)   Anesthesia Other Findings   Reproductive/Obstetrics                              Anesthesia Physical Anesthesia Plan  ASA: 2  Anesthesia Plan: General   Post-op Pain Management: Dilaudid IV and Precedex   Induction: Intravenous  PONV Risk Score and Plan: 3 and Treatment may vary due to age or medical condition, Ondansetron and Dexamethasone  Airway Management Planned: Oral ETT and Simple Face Mask  Additional Equipment: None  Intra-op Plan:   Post-operative Plan: Extubation in OR  Informed Consent: I have reviewed the patients History and Physical, chart, labs and discussed the procedure including the risks, benefits and alternatives for the proposed anesthesia with the patient or authorized representative who has indicated his/her understanding and acceptance.     Dental advisory given  Plan Discussed with: Anesthesiologist and CRNA  Anesthesia Plan  Comments: (See PAT note from 8/21 by K Gekas PA-C )         Anesthesia Quick Evaluation

## 2023-07-26 NOTE — Progress Notes (Signed)
Choose an anesthesia record to view details        DISCUSSION: Micheal Baxter is a 52 year old male who presents to PAT prior to total thyroidectomy by Dr. Gerrit Friends on 08/04/2023.  Past medical history significant for hypertension, migraines, history of kidney stones, history of TIA, history of TBI.  Complication from anesthesia includes prolonged emergence  Patient was referred by his PCP to general surgery for surgical evaluation and management of a multinodular thyroid goiter with compressive symptoms.  Patient came to PAT on 8/21.  Blood pressure was noted to be elevated. He does take BP meds.  Patient also reported of shortness of breath with exertion therefore patient was called to clarify symptoms.  He states that he has significant dysphagia with solids and liquids.  He also reports he feels short of breath with exertion and when lying down due to feeling like his goiter is compressing his trachea.  He denies chest pain or shortness of breath that sounds cardiac in nature.    Discussed with Roslynn Amble, MD.  Patient has good functional status. EKG does not show acute ischemic changes. Ok to proceed without cardiac w/u   VS: BP (!) 140/105   Pulse 82   Temp 36.9 C (Oral)   Resp 16   Ht 5\' 10"  (1.778 m)   Wt 90.7 kg   SpO2 96%   BMI 28.70 kg/m   PROVIDERS: Nadyne Coombes, MD   LABS: Labs reviewed: Acceptable for surgery. (all labs ordered are listed, but only abnormal results are displayed)  Labs Reviewed  BASIC METABOLIC PANEL - Abnormal; Notable for the following components:      Result Value   Glucose, Bld 102 (*)    Calcium 8.5 (*)    All other components within normal limits  CBC     IMAGES:   EKG 07/19/23  NSR, rate 75 LAD Cannot r/o anterior infarct Appears similar to prior EKG   CV:  Past Medical History:  Diagnosis Date   Complication of anesthesia    slow to wake up   COVID-19 12/04/2020   Asymptomatic   Dyspnea    Head trauma    History of  kidney stones    Hypertension    on meds   Migraines    Nerve damage    Scoliosis    Stroke (HCC)    mini   TBI (traumatic brain injury) (HCC)     Past Surgical History:  Procedure Laterality Date   COLONOSCOPY  04/14/2023   no polyps   KNEE ARTHROSCOPY Bilateral    TONSILLECTOMY Bilateral 04/14/2021   Procedure: TONSILLECTOMY;  Surgeon: Geanie Logan, MD;  Location: [facility] ORS;  Service: ENT;  Laterality: Bilateral;    MEDICATIONS:  gabapentin (NEURONTIN) 100 MG capsule   losartan-hydrochlorothiazide (HYZAAR) 100-25 MG tablet   methocarbamol (ROBAXIN) 500 MG tablet   ondansetron (ZOFRAN-ODT) 4 MG disintegrating tablet   pregabalin (LYRICA) 50 MG capsule   No current facility-administered medications for this encounter.   Marcille Blanco [facility] Surgical Short Stay/Anesthesiology [facility] Phone [phone] 07/26/2023 9:20 AM

## 2023-07-30 ENCOUNTER — Encounter (HOSPITAL_COMMUNITY): Payer: Self-pay | Admitting: Surgery

## 2023-07-30 DIAGNOSIS — E049 Nontoxic goiter, unspecified: Secondary | ICD-10-CM | POA: Diagnosis present

## 2023-07-30 DIAGNOSIS — E042 Nontoxic multinodular goiter: Secondary | ICD-10-CM | POA: Diagnosis present
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
## 2023-08-04 ENCOUNTER — Ambulatory Visit (HOSPITAL_COMMUNITY)
Admission: RE | Admit: 2023-08-04 | Discharge: 2023-08-05 | Disposition: A | Discharge status: Home / Self Care | Payer: Medicaid Other | Source: Ambulatory Visit | Attending: Surgery | Admitting: Surgery

## 2023-08-04 ENCOUNTER — Encounter (HOSPITAL_COMMUNITY)
Admission: RE | Disposition: A | Discharge status: Home / Self Care | Payer: Self-pay | Source: Ambulatory Visit | Attending: Surgery

## 2023-08-04 ENCOUNTER — Ambulatory Visit (HOSPITAL_BASED_OUTPATIENT_CLINIC_OR_DEPARTMENT_OTHER): Payer: Medicaid Other | Admitting: Physician Assistant

## 2023-08-04 ENCOUNTER — Encounter (HOSPITAL_COMMUNITY): Payer: Self-pay | Admitting: Surgery

## 2023-08-04 ENCOUNTER — Other Ambulatory Visit: Payer: Self-pay

## 2023-08-04 ENCOUNTER — Ambulatory Visit (HOSPITAL_COMMUNITY): Payer: Medicaid Other | Admitting: Physician Assistant

## 2023-08-04 DIAGNOSIS — E042 Nontoxic multinodular goiter: Secondary | ICD-10-CM | POA: Diagnosis present

## 2023-08-04 DIAGNOSIS — R112 Nausea with vomiting, unspecified: Secondary | ICD-10-CM | POA: Insufficient documentation

## 2023-08-04 DIAGNOSIS — I10 Essential (primary) hypertension: Secondary | ICD-10-CM | POA: Diagnosis not present

## 2023-08-04 DIAGNOSIS — Z79899 Other long term (current) drug therapy: Secondary | ICD-10-CM | POA: Insufficient documentation

## 2023-08-04 DIAGNOSIS — Z87820 Personal history of traumatic brain injury: Secondary | ICD-10-CM | POA: Diagnosis not present

## 2023-08-04 DIAGNOSIS — E049 Nontoxic goiter, unspecified: Secondary | ICD-10-CM | POA: Diagnosis present

## 2023-08-04 HISTORY — PX: THYROIDECTOMY: SHX17

## 2023-08-04 SURGERY — THYROIDECTOMY
Anesthesia: General | Site: Neck

## 2023-08-04 MED ORDER — LABETALOL HCL 5 MG/ML IV SOLN
5.0000 mg | Freq: Once | INTRAVENOUS | Status: AC
  Administered 2023-08-04 (×2): 5 mg via INTRAVENOUS
  Filled 2023-08-04: qty 4

## 2023-08-04 MED ORDER — OXYCODONE HCL 5 MG PO TABS: 5.0000 mg | ORAL_TABLET | Freq: Once | ORAL | Status: DC | PRN

## 2023-08-04 MED ORDER — FENTANYL CITRATE (PF) 250 MCG/5ML IJ SOLN
INTRAMUSCULAR | Status: DC | PRN
  Administered 2023-08-04: 150 ug via INTRAVENOUS
  Administered 2023-08-04 (×2): 50 ug via INTRAVENOUS

## 2023-08-04 MED ORDER — PROPOFOL 1000 MG/100ML IV EMUL
INTRAVENOUS | Status: AC
  Filled 2023-08-04: qty 100

## 2023-08-04 MED ORDER — ORAL CARE MOUTH RINSE: 15.0000 mL | Freq: Once | OROMUCOSAL | Status: AC

## 2023-08-04 MED ORDER — OXYCODONE HCL 5 MG/5ML PO SOLN: 5.0000 mg | Freq: Once | ORAL | Status: DC | PRN

## 2023-08-04 MED ORDER — LIDOCAINE 2% (20 MG/ML) 5 ML SYRINGE
INTRAMUSCULAR | Status: DC | PRN
  Administered 2023-08-04: 100 mg via INTRAVENOUS
  Administered 2023-08-04: 1.5 mg/kg/h via INTRAVENOUS

## 2023-08-04 MED ORDER — PROPOFOL 10 MG/ML IV BOLUS
INTRAVENOUS | Status: DC | PRN
  Administered 2023-08-04: 50 ug/kg/min via INTRAVENOUS
  Administered 2023-08-04: 200 mg via INTRAVENOUS

## 2023-08-04 MED ORDER — ROCURONIUM BROMIDE 10 MG/ML (PF) SYRINGE
PREFILLED_SYRINGE | INTRAVENOUS | Status: AC
  Filled 2023-08-04: qty 10

## 2023-08-04 MED ORDER — CALCIUM CARBONATE ANTACID 500 MG PO CHEW: 2.0000 | CHEWABLE_TABLET | Freq: Two times a day (BID) | ORAL | 1 refills | Status: AC

## 2023-08-04 MED ORDER — LEVOTHYROXINE SODIUM 125 MCG PO TABS
125.0000 ug | ORAL_TABLET | Freq: Every day | ORAL | 2 refills | Status: AC
Start: 2023-08-04 — End: 2024-08-03

## 2023-08-04 MED ORDER — LABETALOL HCL 5 MG/ML IV SOLN
INTRAVENOUS | Status: DC | PRN
Start: 2023-08-04 — End: 2023-08-04
  Administered 2023-08-04: 5 mg via INTRAVENOUS

## 2023-08-04 MED ORDER — ONDANSETRON HCL 4 MG/2ML IJ SOLN
INTRAMUSCULAR | Status: AC
  Filled 2023-08-04: qty 2

## 2023-08-04 MED ORDER — HYDROMORPHONE HCL 1 MG/ML IJ SOLN: 0.2500 mg | INTRAMUSCULAR | Status: DC | PRN

## 2023-08-04 MED ORDER — SUGAMMADEX SODIUM 200 MG/2ML IV SOLN
INTRAVENOUS | Status: DC | PRN
  Administered 2023-08-04: 200 mg via INTRAVENOUS

## 2023-08-04 MED ORDER — MIDAZOLAM HCL 5 MG/5ML IJ SOLN
INTRAMUSCULAR | Status: DC | PRN
  Administered 2023-08-04: 2 mg via INTRAVENOUS

## 2023-08-04 MED ORDER — SODIUM CHLORIDE 0.45 % IV SOLN: INTRAVENOUS | Status: DC

## 2023-08-04 MED ORDER — DEXMEDETOMIDINE HCL IN NACL 80 MCG/20ML IV SOLN
INTRAVENOUS | Status: DC | PRN
Start: 2023-08-04 — End: 2023-08-04
  Administered 2023-08-04: 8 ug via INTRAVENOUS
  Administered 2023-08-04: 4 ug via INTRAVENOUS

## 2023-08-04 MED ORDER — LIDOCAINE HCL (PF) 2 % IJ SOLN
INTRAMUSCULAR | Status: AC
  Filled 2023-08-04: qty 5

## 2023-08-04 MED ORDER — GABAPENTIN 100 MG PO CAPS
200.0000 mg | ORAL_CAPSULE | Freq: Every day | ORAL | Status: DC
  Filled 2023-08-04: qty 2

## 2023-08-04 MED ORDER — SODIUM CHLORIDE 0.9 % IV SOLN
12.5000 mg | Freq: Four times a day (QID) | INTRAVENOUS | Status: DC | PRN
  Administered 2023-08-04: 12.5 mg via INTRAVENOUS
  Filled 2023-08-04: qty 12.5

## 2023-08-04 MED ORDER — PHENYLEPHRINE 80 MCG/ML (10ML) SYRINGE FOR IV PUSH (FOR BLOOD PRESSURE SUPPORT)
PREFILLED_SYRINGE | INTRAVENOUS | Status: AC
  Filled 2023-08-04: qty 10

## 2023-08-04 MED ORDER — HYDROMORPHONE HCL 1 MG/ML IJ SOLN: 1.0000 mg | INTRAMUSCULAR | Status: DC | PRN

## 2023-08-04 MED ORDER — DEXMEDETOMIDINE HCL IN NACL 80 MCG/20ML IV SOLN
INTRAVENOUS | Status: AC
  Filled 2023-08-04: qty 20

## 2023-08-04 MED ORDER — CALCIUM CARBONATE 1250 (500 CA) MG PO TABS
2.0000 | ORAL_TABLET | Freq: Three times a day (TID) | ORAL | Status: DC
  Administered 2023-08-04 – 2023-08-05 (×2): 2500 mg via ORAL
  Filled 2023-08-04 (×2): qty 2

## 2023-08-04 MED ORDER — METHOCARBAMOL 500 MG PO TABS
1000.0000 mg | ORAL_TABLET | Freq: Every day | ORAL | Status: DC
  Administered 2023-08-04: 1000 mg via ORAL
  Filled 2023-08-04: qty 2

## 2023-08-04 MED ORDER — CHLORHEXIDINE GLUCONATE CLOTH 2 % EX PADS: 6.0000 | MEDICATED_PAD | Freq: Once | CUTANEOUS | Status: DC

## 2023-08-04 MED ORDER — TRAMADOL HCL 50 MG PO TABS: 50.0000 mg | ORAL_TABLET | Freq: Four times a day (QID) | ORAL | Status: DC | PRN

## 2023-08-04 MED ORDER — TRAMADOL HCL 50 MG PO TABS: 50.0000 mg | ORAL_TABLET | Freq: Four times a day (QID) | ORAL | 0 refills | Status: AC | PRN

## 2023-08-04 MED ORDER — LIDOCAINE HCL 2 % IJ SOLN
INTRAMUSCULAR | Status: AC
  Filled 2023-08-04: qty 20

## 2023-08-04 MED ORDER — ACETAMINOPHEN 650 MG RE SUPP: 650.0000 mg | Freq: Four times a day (QID) | RECTAL | Status: DC | PRN

## 2023-08-04 MED ORDER — 0.9 % SODIUM CHLORIDE (POUR BTL) OPTIME
TOPICAL | Status: DC | PRN
  Administered 2023-08-04: 1000 mL

## 2023-08-04 MED ORDER — LOSARTAN POTASSIUM 50 MG PO TABS
100.0000 mg | ORAL_TABLET | Freq: Every day | ORAL | Status: DC
  Administered 2023-08-04 – 2023-08-05 (×2): 100 mg via ORAL
  Filled 2023-08-04 (×2): qty 2

## 2023-08-04 MED ORDER — ONDANSETRON HCL 4 MG/2ML IJ SOLN: 4.0000 mg | Freq: Once | INTRAMUSCULAR | Status: DC | PRN

## 2023-08-04 MED ORDER — ROCURONIUM BROMIDE 10 MG/ML (PF) SYRINGE
PREFILLED_SYRINGE | INTRAVENOUS | Status: DC | PRN
  Administered 2023-08-04: 20 mg via INTRAVENOUS
  Administered 2023-08-04: 80 mg via INTRAVENOUS

## 2023-08-04 MED ORDER — ONDANSETRON 4 MG PO TBDP: 4.0000 mg | ORAL_TABLET | Freq: Four times a day (QID) | ORAL | Status: DC | PRN

## 2023-08-04 MED ORDER — HYDROCHLOROTHIAZIDE 25 MG PO TABS
25.0000 mg | ORAL_TABLET | Freq: Every day | ORAL | Status: DC
  Administered 2023-08-04 – 2023-08-05 (×2): 25 mg via ORAL
  Filled 2023-08-04 (×2): qty 1

## 2023-08-04 MED ORDER — ONDANSETRON HCL 4 MG/2ML IJ SOLN
4.0000 mg | Freq: Four times a day (QID) | INTRAMUSCULAR | Status: DC | PRN
  Administered 2023-08-04: 4 mg via INTRAVENOUS
  Filled 2023-08-04: qty 2

## 2023-08-04 MED ORDER — CEFAZOLIN SODIUM-DEXTROSE 2-4 GM/100ML-% IV SOLN
2.0000 g | INTRAVENOUS | Status: AC
  Administered 2023-08-04: 2 g via INTRAVENOUS
  Filled 2023-08-04: qty 100

## 2023-08-04 MED ORDER — MIDAZOLAM HCL 2 MG/2ML IJ SOLN
INTRAMUSCULAR | Status: AC
  Filled 2023-08-04: qty 2

## 2023-08-04 MED ORDER — ACETAMINOPHEN 325 MG PO TABS
650.0000 mg | ORAL_TABLET | Freq: Four times a day (QID) | ORAL | Status: DC | PRN
  Administered 2023-08-04: 650 mg via ORAL
  Filled 2023-08-04: qty 2

## 2023-08-04 MED ORDER — DROPERIDOL 2.5 MG/ML IJ SOLN: 0.6250 mg | Freq: Once | INTRAMUSCULAR | Status: DC | PRN

## 2023-08-04 MED ORDER — DEXAMETHASONE SODIUM PHOSPHATE 10 MG/ML IJ SOLN
INTRAMUSCULAR | Status: DC | PRN
  Administered 2023-08-04: 5 mg via INTRAVENOUS

## 2023-08-04 MED ORDER — OXYCODONE HCL 5 MG PO TABS: 5.0000 mg | ORAL_TABLET | ORAL | Status: DC | PRN

## 2023-08-04 MED ORDER — PREGABALIN 25 MG PO CAPS: 50.0000 mg | ORAL_CAPSULE | Freq: Every day | ORAL | Status: DC

## 2023-08-04 MED ORDER — DEXAMETHASONE SODIUM PHOSPHATE 10 MG/ML IJ SOLN
INTRAMUSCULAR | Status: AC
  Filled 2023-08-04: qty 1

## 2023-08-04 MED ORDER — LOSARTAN POTASSIUM-HCTZ 100-25 MG PO TABS: 1.0000 | ORAL_TABLET | Freq: Every day | ORAL | Status: DC

## 2023-08-04 MED ORDER — HEMOSTATIC AGENTS (NO CHARGE) OPTIME
TOPICAL | Status: DC | PRN
Start: 2023-08-04 — End: 2023-08-04
  Administered 2023-08-04: 1 via TOPICAL

## 2023-08-04 MED ORDER — LACTATED RINGERS IV SOLN: INTRAVENOUS | Status: DC

## 2023-08-04 MED ORDER — PHENYLEPHRINE 80 MCG/ML (10ML) SYRINGE FOR IV PUSH (FOR BLOOD PRESSURE SUPPORT)
PREFILLED_SYRINGE | INTRAVENOUS | Status: DC | PRN
Start: 2023-08-04 — End: 2023-08-04
  Administered 2023-08-04 (×3): 40 ug via INTRAVENOUS

## 2023-08-04 MED ORDER — PROPOFOL 10 MG/ML IV BOLUS
INTRAVENOUS | Status: AC
  Filled 2023-08-04: qty 20

## 2023-08-04 MED ORDER — FENTANYL CITRATE (PF) 250 MCG/5ML IJ SOLN
INTRAMUSCULAR | Status: AC
  Filled 2023-08-04: qty 5

## 2023-08-04 MED ORDER — CHLORHEXIDINE GLUCONATE 0.12 % MT SOLN
15.0000 mL | Freq: Once | OROMUCOSAL | Status: AC
  Administered 2023-08-04: 15 mL via OROMUCOSAL

## 2023-08-04 MED ORDER — ONDANSETRON HCL 4 MG/2ML IJ SOLN
INTRAMUSCULAR | Status: DC | PRN
  Administered 2023-08-04: 4 mg via INTRAVENOUS

## 2023-08-04 SURGICAL SUPPLY — 33 items
ADH SKN CLS APL DERMABOND .7 (GAUZE/BANDAGES/DRESSINGS) ×1
APL PRP STRL LF DISP 70% ISPRP (MISCELLANEOUS) ×1
ATTRACTOMAT 16X20 MAGNETIC DRP (DRAPES) ×1 IMPLANT
BAG COUNTER SPONGE SURGICOUNT (BAG) ×1 IMPLANT
BAG SPNG CNTER NS LX DISP (BAG) ×1
BLADE SURG 15 STRL LF DISP TIS (BLADE) ×1 IMPLANT
BLADE SURG 15 STRL SS (BLADE) ×1
CHLORAPREP W/TINT 26 (MISCELLANEOUS) ×1 IMPLANT
CLIP TI MEDIUM 6 (CLIP) ×2 IMPLANT
CLIP TI WIDE RED SMALL 6 (CLIP) ×2 IMPLANT
COVER SURGICAL LIGHT HANDLE (MISCELLANEOUS) ×1 IMPLANT
DERMABOND ADVANCED .7 DNX12 (GAUZE/BANDAGES/DRESSINGS) ×1 IMPLANT
DRAPE LAPAROTOMY T 98X78 PEDS (DRAPES) ×1 IMPLANT
DRAPE UTILITY XL STRL (DRAPES) ×1 IMPLANT
ELECT PENCIL ROCKER SW 15FT (MISCELLANEOUS) ×1 IMPLANT
ELECT REM PT RETURN 15FT ADLT (MISCELLANEOUS) ×1 IMPLANT
GAUZE 4X4 16PLY ~~LOC~~+RFID DBL (SPONGE) ×1 IMPLANT
GLOVE SURG ORTHO 8.0 STRL STRW (GLOVE) ×1 IMPLANT
GOWN STRL REUS W/ TWL XL LVL3 (GOWN DISPOSABLE) ×2 IMPLANT
GOWN STRL REUS W/TWL XL LVL3 (GOWN DISPOSABLE) ×2
HEMOSTAT SURGICEL 2X4 FIBR (HEMOSTASIS) ×1 IMPLANT
ILLUMINATOR WAVEGUIDE N/F (MISCELLANEOUS) ×1 IMPLANT
KIT BASIN OR (CUSTOM PROCEDURE TRAY) ×1 IMPLANT
KIT TURNOVER KIT A (KITS) IMPLANT
PACK BASIC VI WITH GOWN DISP (CUSTOM PROCEDURE TRAY) ×1 IMPLANT
SHEARS HARMONIC 9CM CVD (BLADE) ×1 IMPLANT
SUT MNCRL AB 4-0 PS2 18 (SUTURE) ×1 IMPLANT
SUT SILK 3 0 SH 30 (SUTURE) ×1 IMPLANT
SUT VIC AB 3-0 SH 18 (SUTURE) ×2 IMPLANT
SYR BULB IRRIG 60ML STRL (SYRINGE) ×1 IMPLANT
TOWEL OR 17X26 10 PK STRL BLUE (TOWEL DISPOSABLE) ×1 IMPLANT
TOWEL OR NON WOVEN STRL DISP B (DISPOSABLE) ×1 IMPLANT
TUBING CONNECTING 10 (TUBING) ×1 IMPLANT

## 2023-08-04 NOTE — Anesthesia Postprocedure Evaluation (Signed)
Anesthesia Post Note  Patient: Micheal Baxter  Procedure(s) Performed: TOTAL THYROIDECTOMY (Neck)     Patient location during evaluation: PACU Anesthesia Type: General Level of consciousness: awake and alert and oriented Pain management: pain level controlled Vital Signs Assessment: post-procedure vital signs reviewed and stable Respiratory status: spontaneous breathing, nonlabored ventilation and respiratory function stable Cardiovascular status: blood pressure returned to baseline and stable Postop Assessment: no apparent nausea or vomiting Anesthetic complications: no   No notable events documented.  Last Vitals:  Vitals:   08/04/23 1015 08/04/23 1338  BP: (!) 155/96 (!) 162/104  Pulse:  71  Resp:  12  Temp:  (!) 35.9 C  SpO2:  93%    Last Pain:  Vitals:   08/04/23 1338  TempSrc:   PainSc: Asleep                 Pieter Fooks A.

## 2023-08-04 NOTE — Interval H&P Note (Signed)
History and Physical Interval Note:  08/04/2023 10:44 AM  Micheal Baxter  has presented today for surgery, with the diagnosis of MULTINODULAR THYROID GOITER.  The various methods of treatment have been discussed with the patient and family. After consideration of risks, benefits and other options for treatment, the patient has consented to    Procedure(s): TOTAL THYROIDECTOMY (N/A) as a surgical intervention.    The patient's history has been reviewed, patient examined, no change in status, stable for surgery.  I have reviewed the patient's chart and labs.  Questions were answered to the patient's satisfaction.    Darnell Level, MD [facility] practice Office: [phone]   Darnell Level

## 2023-08-04 NOTE — Anesthesia Procedure Notes (Signed)
Procedure Name: Intubation Date/Time: 08/04/2023 11:16 AM  Performed by: Orest Dikes, CRNAPre-anesthesia Checklist: Patient identified, Emergency Drugs available, Suction available and Patient being monitored Patient Re-evaluated:Patient Re-evaluated prior to induction Oxygen Delivery Method: Circle system utilized Preoxygenation: Pre-oxygenation with 100% oxygen Induction Type: IV induction Ventilation: Mask ventilation without difficulty Laryngoscope Size: Glidescope and 4 Tube type: Oral Tube size: 7.5 mm Number of attempts: 1 Airway Equipment and Method: Stylet Placement Confirmation: ETT inserted through vocal cords under direct vision, positive ETCO2 and breath sounds checked- equal and bilateral Secured at: 21 cm Tube secured with: Tape Dental Injury: Teeth and Oropharynx as per pre-operative assessment  Comments: Elective Glidescope intubation based on airway exam. DL x 1 with Glidescope 4 with Grade 1 view. Ett gently placed.

## 2023-08-04 NOTE — Transfer of Care (Signed)
Immediate Anesthesia Transfer of Care Note  Patient: Micheal Baxter  Procedure(s) Performed: TOTAL THYROIDECTOMY (Neck)  Patient Location: PACU  Anesthesia Type:General  Level of Consciousness: drowsy  Airway & Oxygen Therapy: Patient Spontanous Breathing and Patient connected to face mask oxygen  Post-op Assessment: Report given to RN and Post -op Vital signs reviewed and stable  Post vital signs: Reviewed and stable  Last Vitals:  Vitals Value Taken Time  BP 162/104 08/04/23 1332  Temp    Pulse 71 08/04/23 1335  Resp 12 08/04/23 1335  SpO2 94 % 08/04/23 1335  Vitals shown include unfiled device data.  Last Pain:  Vitals:   08/04/23 1015  TempSrc:   PainSc: 1       Patients Stated Pain Goal: 4 (08/04/23 4098)  Complications: No notable events documented.

## 2023-08-04 NOTE — Progress Notes (Signed)
   08/04/23 1530  TOC Brief Assessment  Insurance and Status Reviewed  Patient has primary care physician Yes  Home environment has been reviewed Resides with spouse and children  Prior level of function: Independent at baseline  Prior/Current Home Services No current home services  Social Determinants of Health Reivew SDOH reviewed no interventions necessary  Readmission risk has been reviewed Yes  Transition of care needs no transition of care needs at this time

## 2023-08-04 NOTE — Discharge Instructions (Signed)
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
## 2023-08-05 ENCOUNTER — Encounter (HOSPITAL_COMMUNITY): Payer: Self-pay | Admitting: Surgery

## 2023-08-05 DIAGNOSIS — E042 Nontoxic multinodular goiter: Secondary | ICD-10-CM | POA: Diagnosis not present

## 2023-08-05 LAB — CALCIUM: Calcium: 9.4 mg/dL (ref 8.9–10.3)

## 2023-08-05 NOTE — Progress Notes (Signed)
1 Day Post-Op   Subjective/Chief Complaint: Nauseated overnight with vomiting but resolved today   Objective: Vital signs in last 24 hours: Temp:  [96.6 F (35.9 C)-98.6 F (37 C)] 98.4 F (36.9 C) (09/07 0441) Pulse Rate:  [66-101] 101 (09/07 0441) Resp:  [11-20] 17 (09/07 0441) BP: (143-177)/(92-113) 151/93 (09/07 0441) SpO2:  [91 %-100 %] 95 % (09/07 0441)    Intake/Output from previous day: 09/06 0701 - 09/07 0700 In: 2230 [P.O.:480; I.V.:1600; IV Piggyback:150] Out: 1110 [Urine:800; Emesis/NG output:300; Blood:10] Intake/Output this shift: No intake/output data recorded.  General appearance: alert and cooperative Resp: clear to auscultation bilaterally Cardio: regular rate and rhythm GI: soft, nontender. Neck soft  Lab Results:  No results for input(s): "WBC", "HGB", "HCT", "PLT" in the last 72 hours. BMET Recent Labs    08/05/23 0435  CALCIUM 9.4   PT/INR No results for input(s): "LABPROT", "INR" in the last 72 hours. ABG No results for input(s): "PHART", "HCO3" in the last 72 hours.  Invalid input(s): "PCO2", "PO2"  Studies/Results: No results found.  Anti-infectives: Anti-infectives (From admission, onward)    Start     Dose/Rate Route Frequency Ordered Stop   08/04/23 0915  ceFAZolin (ANCEF) IVPB 2g/100 mL premix        2 g 200 mL/hr over 30 Minutes Intravenous On call to O.R. 08/04/23 0913 08/04/23 1117       Assessment/Plan: s/p Procedure(s): TOTAL THYROIDECTOMY (N/A) Advance diet Discharge  Ca 9.4 stable  LOS: 0 days    Micheal Baxter 08/05/2023

## 2023-08-05 NOTE — Plan of Care (Signed)

## 2023-08-05 NOTE — Progress Notes (Signed)
Pt was discharged home today. Instructions were reviewed with patient, and questions were answered. Pt was taken to main entrance via wheelchair by NT.  

## 2023-08-07 LAB — SURGICAL PATHOLOGY

## 2023-08-07 NOTE — Progress Notes (Signed)
Pathology is benign, as expected.  Good news. ? ?tmg ? ?Darnell Level, MD ?[facility] ?A [facility] practice ?Office: [phone] ?

## 2024-05-15 ENCOUNTER — Ambulatory Visit: Admitting: Urology
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
## 2024-07-14 ENCOUNTER — Emergency Department
Admission: EM | Admit: 2024-07-14 | Discharge: 2024-07-14 | Disposition: A | Discharge status: Home / Self Care | Attending: Emergency Medicine | Admitting: Emergency Medicine

## 2024-07-14 ENCOUNTER — Other Ambulatory Visit: Payer: Self-pay

## 2024-07-14 DIAGNOSIS — Z203 Contact with and (suspected) exposure to rabies: Secondary | ICD-10-CM | POA: Insufficient documentation

## 2024-07-14 DIAGNOSIS — Z2914 Encounter for prophylactic rabies immune globin: Secondary | ICD-10-CM | POA: Insufficient documentation

## 2024-07-14 DIAGNOSIS — W540XXA Bitten by dog, initial encounter: Secondary | ICD-10-CM | POA: Insufficient documentation

## 2024-07-14 DIAGNOSIS — R55 Syncope and collapse: Secondary | ICD-10-CM | POA: Insufficient documentation

## 2024-07-14 DIAGNOSIS — Z23 Encounter for immunization: Secondary | ICD-10-CM | POA: Diagnosis not present

## 2024-07-14 DIAGNOSIS — S81852A Open bite, left lower leg, initial encounter: Secondary | ICD-10-CM | POA: Diagnosis present

## 2024-07-14 HISTORY — DX: Unspecified convulsions: R56.9

## 2024-07-14 LAB — COMPREHENSIVE METABOLIC PANEL WITH GFR
ALT: 16 U/L (ref 0–44)
AST: 30 U/L (ref 15–41)
Albumin: 4.1 g/dL (ref 3.5–5.0)
Alkaline Phosphatase: 61 U/L (ref 38–126)
Anion gap: 11 (ref 5–15)
BUN: 15 mg/dL (ref 6–20)
CO2: 23 mmol/L (ref 22–32)
Calcium: 9 mg/dL (ref 8.9–10.3)
Chloride: 107 mmol/L (ref 98–111)
Creatinine, Ser: 1.26 mg/dL — ABNORMAL HIGH (ref 0.61–1.24)
GFR, Estimated: >60 mL/min (ref >60)
Glucose, Bld: 147 mg/dL — ABNORMAL HIGH (ref 70–99)
Potassium: 3.4 mmol/L — ABNORMAL LOW (ref 3.5–5.1)
Sodium: 141 mmol/L (ref 135–145)
Total Bilirubin: 0.9 mg/dL (ref 0.0–1.2)
Total Protein: 8.2 g/dL — ABNORMAL HIGH (ref 6.5–8.1)

## 2024-07-14 LAB — URINALYSIS, ROUTINE W REFLEX MICROSCOPIC
Bacteria, UA: NONE SEEN
Bilirubin Urine: NEGATIVE
Glucose, UA: NEGATIVE mg/dL
Hgb urine dipstick: NEGATIVE
Ketones, ur: NEGATIVE mg/dL
Leukocytes,Ua: NEGATIVE
Nitrite: NEGATIVE
Protein, ur: 100 mg/dL — AB
Specific Gravity, Urine: 1.03 (ref 1.005–1.030)
Squamous Epithelial / HPF: 0 /HPF (ref 0–5)
pH: 7 (ref 5.0–8.0)

## 2024-07-14 LAB — CBC
HCT: 41.2 % (ref 39.0–52.0)
Hemoglobin: 14.2 g/dL (ref 13.0–17.0)
MCH: 28.9 pg (ref 26.0–34.0)
MCHC: 34.5 g/dL (ref 30.0–36.0)
MCV: 83.9 fL (ref 80.0–100.0)
Platelets: 243 K/uL (ref 150–400)
RBC: 4.91 MIL/uL (ref 4.22–5.81)
RDW: 12.5 % (ref 11.5–15.5)
WBC: 4.5 K/uL (ref 4.0–10.5)
nRBC: 0 % (ref 0.0–0.2)

## 2024-07-14 MED ORDER — RABIES IMMUNE GLOBULIN 300 UNIT/2ML IJ SOLN
20.0000 [IU]/kg | Freq: Once | INTRAMUSCULAR | Status: AC
  Administered 2024-07-14: 1800 [IU] via INTRAMUSCULAR
  Filled 2024-07-14: qty 2

## 2024-07-14 MED ORDER — AMOXICILLIN-POT CLAVULANATE 875-125 MG PO TABS: 1.0000 | ORAL_TABLET | Freq: Two times a day (BID) | ORAL | 0 refills | Status: AC

## 2024-07-14 MED ORDER — RABIES VACCINE, PCEC IM SUSR
1.0000 mL | Freq: Once | INTRAMUSCULAR | Status: AC
  Administered 2024-07-14: 1 mL via INTRAMUSCULAR
  Filled 2024-07-14: qty 1

## 2024-07-14 MED ORDER — AMOXICILLIN-POT CLAVULANATE 875-125 MG PO TABS
1.0000 | ORAL_TABLET | Freq: Once | ORAL | Status: AC
  Administered 2024-07-14: 1 via ORAL
  Filled 2024-07-14: qty 1

## 2024-07-14 MED ORDER — TETANUS-DIPHTH-ACELL PERTUSSIS 5-2.5-18.5 LF-MCG/0.5 IM SUSY
0.5000 mL | PREFILLED_SYRINGE | Freq: Once | INTRAMUSCULAR | Status: AC
  Administered 2024-07-14: 0.5 mL via INTRAMUSCULAR
  Filled 2024-07-14: qty 0.5
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
## 2024-07-14 NOTE — ED Triage Notes (Signed)
 Pt to ED for dog bite to LLE just PTA from a stray dog. Last Tdap unknown. Looks superficial. Pt thinks he had near syncopal episode today with diaphoresis after the dog bite and has new dx absence seizures since about 1 month ago. States has been feeling kind of weak ever since the dog bite.

## 2024-10-25 ENCOUNTER — Other Ambulatory Visit: Payer: Self-pay

## 2024-10-25 DIAGNOSIS — M5442 Lumbago with sciatica, left side: Secondary | ICD-10-CM | POA: Insufficient documentation

## 2024-10-25 DIAGNOSIS — M545 Low back pain, unspecified: Secondary | ICD-10-CM | POA: Diagnosis present
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
## 2024-10-26 ENCOUNTER — Emergency Department

## 2024-10-26 ENCOUNTER — Emergency Department
Admission: EM | Admit: 2024-10-26 | Discharge: 2024-10-26 | Disposition: A | Discharge status: Home / Self Care | Attending: Emergency Medicine | Admitting: Emergency Medicine

## 2024-10-26 DIAGNOSIS — M5442 Lumbago with sciatica, left side: Secondary | ICD-10-CM

## 2024-10-26 MED ORDER — LIDOCAINE 5 % EX PTCH: 1.0000 | MEDICATED_PATCH | Freq: Two times a day (BID) | CUTANEOUS | 0 refills | Status: AC

## 2024-10-26 MED ORDER — CYCLOBENZAPRINE HCL 5 MG PO TABS: 5.0000 mg | ORAL_TABLET | Freq: Three times a day (TID) | ORAL | 0 refills | Status: AC | PRN

## 2024-10-26 MED ORDER — OXYCODONE-ACETAMINOPHEN 5-325 MG PO TABS
2.0000 | ORAL_TABLET | Freq: Once | ORAL | Status: AC
  Administered 2024-10-26: 2 via ORAL
  Filled 2024-10-26: qty 2

## 2024-10-26 MED ORDER — CYCLOBENZAPRINE HCL 5 MG PO TABS: 5.0000 mg | ORAL_TABLET | Freq: Three times a day (TID) | ORAL | 0 refills | Status: DC | PRN

## 2024-10-26 MED ORDER — LIDOCAINE 5 % EX PTCH
1.0000 | MEDICATED_PATCH | CUTANEOUS | Status: DC
  Administered 2024-10-26: 1 via TRANSDERMAL
  Filled 2024-10-26: qty 1

## 2024-10-26 MED ORDER — LIDOCAINE 5 % EX PTCH: 1.0000 | MEDICATED_PATCH | Freq: Two times a day (BID) | CUTANEOUS | 0 refills | Status: DC

## 2024-10-26 MED ORDER — KETOROLAC TROMETHAMINE 30 MG/ML IJ SOLN
30.0000 mg | Freq: Once | INTRAMUSCULAR | Status: AC
  Administered 2024-10-26: 30 mg via INTRAMUSCULAR
  Filled 2024-10-26: qty 1

## 2024-10-26 NOTE — Discharge Instructions (Signed)
 You were evaluated in the Emergency Department today for back pain. Your evaluation suggests no acute abnormalities which require further intervention at this time.   - Move around as tolerated but avoiding heavy lifting. "Bed rest" is not recommended nor is it the best treatment for low back pain.  - Medications will help control your discomfort: -- Ibuprofen (800 mg every 8 hours for pain). -- Any other prescriptions you were provided as per label instructions -- Do not drink alcohol, drive a car, operate machinery, or get up on ladders or heights when taking any prescribed pain medications or muscle relaxers.  -- Do not drive home if you received prescribed pain medications here in the ED.  Please follow up with your primary care physician as needed or any other providers listed in this paperwork. If you do not have a primary doctor, you can call your insurance company to find one.  If you do not have insurance, you can go to the finance/registration department for more assistance.  Return to the ED immediately if you develop any of the following problems: -- Leaking urine or difficulty urinating; -- Inability to control your bowels; -- New numbness or weakness in your legs or numbness between your legs; -- Inability to walk -- Fever
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
